# Patient Record
Sex: Male | Born: 1960
Health system: Southern US, Community
[De-identification: ages and names within clinical notes are randomized; demographics above are authoritative.]

## PROBLEM LIST (undated history)

## (undated) DIAGNOSIS — D126 Benign neoplasm of colon, unspecified: Secondary | ICD-10-CM

## (undated) DIAGNOSIS — N529 Male erectile dysfunction, unspecified: Secondary | ICD-10-CM

## (undated) DIAGNOSIS — Z8042 Family history of malignant neoplasm of prostate: Secondary | ICD-10-CM

## (undated) DIAGNOSIS — G5603 Carpal tunnel syndrome, bilateral upper limbs: Secondary | ICD-10-CM

## (undated) DIAGNOSIS — S37039A Laceration of unspecified kidney, unspecified degree, initial encounter: Secondary | ICD-10-CM

## (undated) DIAGNOSIS — I10 Essential (primary) hypertension: Secondary | ICD-10-CM

## (undated) DIAGNOSIS — M159 Polyosteoarthritis, unspecified: Secondary | ICD-10-CM

## (undated) DIAGNOSIS — R972 Elevated prostate specific antigen [PSA]: Secondary | ICD-10-CM

## (undated) DIAGNOSIS — R7301 Impaired fasting glucose: Secondary | ICD-10-CM

## (undated) DIAGNOSIS — K5790 Diverticulosis of intestine, part unspecified, without perforation or abscess without bleeding: Secondary | ICD-10-CM

## (undated) DIAGNOSIS — E785 Hyperlipidemia, unspecified: Secondary | ICD-10-CM

## (undated) DIAGNOSIS — R7303 Prediabetes: Secondary | ICD-10-CM

## (undated) DIAGNOSIS — G4700 Insomnia, unspecified: Secondary | ICD-10-CM

## (undated) DIAGNOSIS — M797 Fibromyalgia: Secondary | ICD-10-CM

## (undated) DIAGNOSIS — M130 Polyarthritis, unspecified: Secondary | ICD-10-CM

## (undated) DIAGNOSIS — Z8719 Personal history of other diseases of the digestive system: Secondary | ICD-10-CM

## (undated) DIAGNOSIS — S36039A Unspecified laceration of spleen, initial encounter: Secondary | ICD-10-CM

## (undated) DIAGNOSIS — T7840XA Allergy, unspecified, initial encounter: Secondary | ICD-10-CM

## (undated) DIAGNOSIS — K219 Gastro-esophageal reflux disease without esophagitis: Secondary | ICD-10-CM

## (undated) HISTORY — DX: Hyperlipidemia, unspecified: E78.5

## (undated) HISTORY — DX: Male erectile dysfunction, unspecified: N52.9

## (undated) HISTORY — DX: Laceration of unspecified kidney, unspecified degree, initial encounter: S37.039A

## (undated) HISTORY — DX: Polyosteoarthritis, unspecified: M15.9

## (undated) HISTORY — DX: Unspecified laceration of spleen, initial encounter: S36.039A

## (undated) HISTORY — DX: Allergy, unspecified, initial encounter: T78.40XA

## (undated) HISTORY — PX: APPENDECTOMY: SHX54

## (undated) HISTORY — DX: Personal history of other diseases of the digestive system: Z87.19

## (undated) HISTORY — DX: Essential (primary) hypertension: I10

## (undated) HISTORY — DX: Family history of malignant neoplasm of prostate: Z80.42

## (undated) HISTORY — PX: COLONOSCOPY: SHX174

## (undated) HISTORY — DX: Elevated prostate specific antigen (PSA): R97.20

## (undated) HISTORY — DX: Diverticulosis of intestine, part unspecified, without perforation or abscess without bleeding: K57.90

## (undated) HISTORY — PX: POLYPECTOMY: SHX149

## (undated) HISTORY — PX: JOINT REPLACEMENT: SHX530

## (undated) HISTORY — PX: TOTAL KNEE ARTHROPLASTY: SHX125

## (undated) HISTORY — DX: Insomnia, unspecified: G47.00

## (undated) HISTORY — PX: SPINE SURGERY: SHX786

## (undated) HISTORY — PX: BILATERAL CARPAL TUNNEL RELEASE: SHX6508

## (undated) HISTORY — PX: KNEE SURGERY: SHX244

## (undated) HISTORY — DX: Polyarthritis, unspecified: M13.0

## (undated) HISTORY — DX: Carpal tunnel syndrome, bilateral upper limbs: G56.03

## (undated) HISTORY — DX: Prediabetes: R73.03

## (undated) HISTORY — PX: CERVICAL FUSION: SHX112

## (undated) HISTORY — DX: Benign neoplasm of colon, unspecified: D12.6

---
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
## 2016-01-26 ENCOUNTER — Ambulatory Visit: Payer: BLUE CROSS/BLUE SHIELD | Admitting: Family Medicine

## 2016-02-09 ENCOUNTER — Encounter: Payer: Self-pay | Admitting: Family Medicine

## 2016-02-09 DIAGNOSIS — Z125 Encounter for screening for malignant neoplasm of prostate: Secondary | ICD-10-CM | POA: Insufficient documentation

## 2016-02-14 ENCOUNTER — Encounter: Payer: Self-pay | Admitting: Family Medicine

## 2016-02-14 ENCOUNTER — Ambulatory Visit (INDEPENDENT_AMBULATORY_CARE_PROVIDER_SITE_OTHER): Payer: BLUE CROSS/BLUE SHIELD | Admitting: Family Medicine

## 2016-02-14 VITALS — BP 119/73 | HR 70 | Temp 98.4°F | Resp 16 | Ht 70.5 in | Wt 179.8 lb

## 2016-02-14 DIAGNOSIS — F5105 Insomnia due to other mental disorder: Secondary | ICD-10-CM

## 2016-02-14 DIAGNOSIS — G5762 Lesion of plantar nerve, left lower limb: Secondary | ICD-10-CM

## 2016-02-14 DIAGNOSIS — F419 Anxiety disorder, unspecified: Secondary | ICD-10-CM | POA: Diagnosis not present

## 2016-02-14 DIAGNOSIS — M5010 Cervical disc disorder with radiculopathy, unspecified cervical region: Secondary | ICD-10-CM | POA: Diagnosis not present

## 2016-02-14 DIAGNOSIS — I10 Essential (primary) hypertension: Secondary | ICD-10-CM | POA: Diagnosis not present

## 2016-02-14 MED ORDER — LORAZEPAM 0.5 MG PO TABS: 0.5000 mg | ORAL_TABLET | Freq: Every day | ORAL | Status: DC | PRN

## 2016-02-14 NOTE — Patient Instructions (Signed)
1) [facility]: Dr. Berniece Andreas. [phone]  2) Metatarsal pad (shoe insert).

## 2016-02-14 NOTE — Progress Notes (Signed)
Office Note 02/14/2016  CC:  Chief Complaint  Patient presents with  . Establish Care    Pt is not fasting.   . Foot Pain    left foot, 2nd digit x 1.5 months    HPI:  Billy Bates is a 55 y.o. White male who is here to establish care. Patient's most recent primary MD: [facility] in [place], [place]. Old records were reviewed prior to or during today's visit.  His main chronic issue is HTN, which he states is well controlled and he has no probs with his medication.  He also has some anxiety-related insomnia, for which he uses ativan 0.5mg  qhs prn with some success.  Asks for RF of this med today.  Pt states he is getting persistent numbness sensation in R shoulder blade extending all the way down R arm to tips of fingers (all).  Going on for a few months now.  No preceding trauma or strain. Sits in front of a computer a lot at work.  No weakness of arm.  Used to see chiropracter for his neck and this was helpful.  Has hx of cervical spondylosis/DDD with fusion surgery in remote past.  Left 2nd toe pain, swelling at the base.  Getting progressively worse.  This alters his gait sometimes. He occ takes ibuprofen "when it gets bad enough".  Past Medical History  Diagnosis Date  . Hypertension   . Insomnia   . Splenic laceration     Hx of, 1980  . Kidney laceration     Hx of, 1980  . History of colitis     Lymphocytic colitis; episodic flares (most recent approx 2015)  . Organic impotence   . Elevated PSA     Repeat 1 mo later was back down.  No bx.  No urology referral.  . Hyperlipidemia     controlled with TLC.  . Family history of prostate cancer     Father  . Arthritis     neck/knees    Past Surgical History  Procedure Laterality Date  . Cervical fusion  approx 1999    C5-6  . Knee surgery      Ex baseball player.  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 Colonoscopy  2009    polypectomy (adenomatous)--overdue for repeat  . Tonsillectomy and  adenoidectomy  1968    Family History  Problem Relation Age of Onset  . Breast cancer Mother   . Heart disease Mother   . Arthritis Father   . Prostate cancer Father   . Stroke Father   . Hypertension Father   . Diabetes Neg Hx     Social History   Social History  . Marital Status: Married    Spouse Name: N/A  . Number of Children: N/A  . Years of Education: N/A   Occupational History  . Not on file.   Social History Main Topics  . Smoking status: Never Smoker   . Smokeless tobacco: Current User    Types: Snuff  . Alcohol Use: 1.2 - 1.8 oz/week    2-3 Cans of beer per week  . Drug Use: No  . Sexual Activity: Not on file   Other Topics Concern  . Not on file   Social History Narrative   Married, 2 daughters.   Educ: BS   Occupation: Freight forwarder for [employer]      No [place] or drugs.  Occ alcohol.    Outpatient Encounter Prescriptions as of 02/14/2016  Medication Sig  . lisinopril (PRINIVIL,ZESTRIL) 10 MG tablet Take 10 mg by mouth daily.  [name] LORazepam (ATIVAN) 0.5 MG tablet Take 1 tablet (0.5 mg total) by mouth daily as needed.  . [DISCONTINUED] LORazepam (ATIVAN) 0.5 MG tablet Take 0.5 mg by mouth daily as needed.   No facility-administered encounter medications on file as of 02/14/2016.    No Known Allergies  ROS Review of Systems  Constitutional: Negative for fever and fatigue.  HENT: Negative for congestion and sore throat.   Eyes: Negative for visual disturbance.  Respiratory: Negative for cough.   Cardiovascular: Negative for chest pain.  Gastrointestinal: Negative for nausea and abdominal pain.  Genitourinary: Negative for dysuria.  Musculoskeletal: Negative for back pain and joint swelling.  Skin: Negative for rash.  Neurological: Negative for weakness and headaches.  Hematological: Negative for adenopathy.    PE; Blood pressure 119/73, pulse 70, temperature 98.4 F (36.9 C), temperature source Oral, resp. rate 16, height 5' 10.5" (1.791 m), weight  179 lb 12 oz (81.534 kg), SpO2 96 %. Body mass index is 25.42 kg/(m^2).  Gen: Alert, well appearing.  Patient is oriented to person, place, time, and situation. CV: RRR, no m/r/g.   LUNGS: CTA bilat, nonlabored resps, good aeration in all lung fields. EXT: no clubbing, cyanosis, or edema.  Neck: mild diffuse "soreness" to deep palpation of cervical spine soft tissues. Spurling's + (elicits radiculopathy sx's down R arm).  UE strength 5/5 prox and dist bilat. DTRs: 1+ in biceps on L, absent biceps reflex on R. Soft touch sensation intact in both arms/symmetric. Left foot: Mild soft tissue swelling at 2nd toe MTP joint.  Diffuse tenderness over this joint and distal 2nd metatarsal and web space between toes 1&2 and toes 2 & 3.  No erythema.  ROM intact.  More tender on plantar aspect than dorsal aspect.  Pertinent labs:  None today  ASSESSMENT AND PLAN:   1) Essential HTN: The current medical regimen is effective;  continue present plan and medications.  2) Insomnia: The current medical regimen is effective;  continue present plan and medications. RF'd ativan today.  3) Left foot Morton neuroma suspected around 2nd MTP region. Pt not interested in referral for injection at this time. Discussed metatarsal shoe insert today.  4) Cervical radiculopathy, right: pt has had success with chiropractic manipulations in the past when this occurred on L side.  Gave pt local chiropracter office info [facility], Dr. Berniece Andreas).  An After Visit Summary was printed and given to the patient.  Return for annual CPE with fasting labs the week prior--at patient's convenience.  Signed:  Crissie Sickles, MD           02/14/2016

## 2016-02-14 NOTE — Progress Notes (Signed)
Pre visit review using our clinic review tool, if applicable. No additional management support is needed unless otherwise documented below in the visit note. 

## 2016-02-17 DIAGNOSIS — M5033 Other cervical disc degeneration, cervicothoracic region: Secondary | ICD-10-CM | POA: Diagnosis not present

## 2016-02-17 DIAGNOSIS — M9901 Segmental and somatic dysfunction of cervical region: Secondary | ICD-10-CM | POA: Diagnosis not present

## 2016-02-17 DIAGNOSIS — M531 Cervicobrachial syndrome: Secondary | ICD-10-CM | POA: Diagnosis not present

## 2016-02-17 DIAGNOSIS — M5032 Other cervical disc degeneration, mid-cervical region, unspecified level: Secondary | ICD-10-CM | POA: Diagnosis not present

## 2016-02-21 DIAGNOSIS — M5032 Other cervical disc degeneration, mid-cervical region, unspecified level: Secondary | ICD-10-CM | POA: Diagnosis not present

## 2016-02-21 DIAGNOSIS — M9901 Segmental and somatic dysfunction of cervical region: Secondary | ICD-10-CM | POA: Diagnosis not present

## 2016-02-21 DIAGNOSIS — M5033 Other cervical disc degeneration, cervicothoracic region: Secondary | ICD-10-CM | POA: Diagnosis not present

## 2016-02-21 DIAGNOSIS — M531 Cervicobrachial syndrome: Secondary | ICD-10-CM | POA: Diagnosis not present

## 2016-02-24 DIAGNOSIS — M5032 Other cervical disc degeneration, mid-cervical region, unspecified level: Secondary | ICD-10-CM | POA: Diagnosis not present

## 2016-02-24 DIAGNOSIS — M9901 Segmental and somatic dysfunction of cervical region: Secondary | ICD-10-CM | POA: Diagnosis not present

## 2016-02-24 DIAGNOSIS — M5033 Other cervical disc degeneration, cervicothoracic region: Secondary | ICD-10-CM | POA: Diagnosis not present

## 2016-02-24 DIAGNOSIS — M531 Cervicobrachial syndrome: Secondary | ICD-10-CM | POA: Diagnosis not present

## 2016-02-27 DIAGNOSIS — M9901 Segmental and somatic dysfunction of cervical region: Secondary | ICD-10-CM | POA: Diagnosis not present

## 2016-02-27 DIAGNOSIS — M5032 Other cervical disc degeneration, mid-cervical region, unspecified level: Secondary | ICD-10-CM | POA: Diagnosis not present

## 2016-02-27 DIAGNOSIS — M5033 Other cervical disc degeneration, cervicothoracic region: Secondary | ICD-10-CM | POA: Diagnosis not present

## 2016-02-27 DIAGNOSIS — M531 Cervicobrachial syndrome: Secondary | ICD-10-CM | POA: Diagnosis not present

## 2016-03-02 DIAGNOSIS — M5033 Other cervical disc degeneration, cervicothoracic region: Secondary | ICD-10-CM | POA: Diagnosis not present

## 2016-03-02 DIAGNOSIS — M531 Cervicobrachial syndrome: Secondary | ICD-10-CM | POA: Diagnosis not present

## 2016-03-02 DIAGNOSIS — M5032 Other cervical disc degeneration, mid-cervical region, unspecified level: Secondary | ICD-10-CM | POA: Diagnosis not present

## 2016-03-02 DIAGNOSIS — M9901 Segmental and somatic dysfunction of cervical region: Secondary | ICD-10-CM | POA: Diagnosis not present

## 2016-03-06 ENCOUNTER — Telehealth: Payer: Self-pay | Admitting: Family Medicine

## 2016-03-06 DIAGNOSIS — M531 Cervicobrachial syndrome: Secondary | ICD-10-CM | POA: Diagnosis not present

## 2016-03-06 DIAGNOSIS — M5033 Other cervical disc degeneration, cervicothoracic region: Secondary | ICD-10-CM | POA: Diagnosis not present

## 2016-03-06 DIAGNOSIS — M5032 Other cervical disc degeneration, mid-cervical region, unspecified level: Secondary | ICD-10-CM | POA: Diagnosis not present

## 2016-03-06 DIAGNOSIS — M9901 Segmental and somatic dysfunction of cervical region: Secondary | ICD-10-CM | POA: Diagnosis not present

## 2016-03-06 MED ORDER — PREDNISONE 20 MG PO TABS: ORAL_TABLET | ORAL | 0 refills | Status: DC

## 2016-03-06 NOTE — Telephone Encounter (Signed)
Patient came into office stating that he was at [facility] this morning and was asked to see if Dr. Anitra Lauth could Rx a dose of prednisone reduce inflammation to help.   Please advise.

## 2016-03-06 NOTE — Telephone Encounter (Signed)
OK.  Pls eRx prednisone 20mg , 2 tabs po qd x 5d, then 1 tab po qd x 5d, #15, no RF.--thx

## 2016-03-06 NOTE — Telephone Encounter (Signed)
Patient aware of Rx at pharmacy.

## 2016-03-13 DIAGNOSIS — M9901 Segmental and somatic dysfunction of cervical region: Secondary | ICD-10-CM | POA: Diagnosis not present

## 2016-03-13 DIAGNOSIS — M531 Cervicobrachial syndrome: Secondary | ICD-10-CM | POA: Diagnosis not present

## 2016-03-13 DIAGNOSIS — M5032 Other cervical disc degeneration, mid-cervical region, unspecified level: Secondary | ICD-10-CM | POA: Diagnosis not present

## 2016-03-13 DIAGNOSIS — M5033 Other cervical disc degeneration, cervicothoracic region: Secondary | ICD-10-CM | POA: Diagnosis not present

## 2016-03-21 DIAGNOSIS — M531 Cervicobrachial syndrome: Secondary | ICD-10-CM | POA: Diagnosis not present

## 2016-03-21 DIAGNOSIS — M5033 Other cervical disc degeneration, cervicothoracic region: Secondary | ICD-10-CM | POA: Diagnosis not present

## 2016-03-21 DIAGNOSIS — M5032 Other cervical disc degeneration, mid-cervical region, unspecified level: Secondary | ICD-10-CM | POA: Diagnosis not present

## 2016-03-21 DIAGNOSIS — M9901 Segmental and somatic dysfunction of cervical region: Secondary | ICD-10-CM | POA: Diagnosis not present

## 2016-03-25 ENCOUNTER — Encounter: Payer: Self-pay | Admitting: Family Medicine

## 2016-03-25 ENCOUNTER — Ambulatory Visit (INDEPENDENT_AMBULATORY_CARE_PROVIDER_SITE_OTHER): Payer: BLUE CROSS/BLUE SHIELD | Admitting: Family Medicine

## 2016-03-25 VITALS — BP 117/75 | HR 57 | Temp 97.4°F | Resp 16 | Ht 70.0 in | Wt 180.0 lb

## 2016-03-25 DIAGNOSIS — Z125 Encounter for screening for malignant neoplasm of prostate: Secondary | ICD-10-CM

## 2016-03-25 DIAGNOSIS — Z23 Encounter for immunization: Secondary | ICD-10-CM

## 2016-03-25 DIAGNOSIS — Z86010 Personal history of colonic polyps: Secondary | ICD-10-CM | POA: Diagnosis not present

## 2016-03-25 DIAGNOSIS — Z0000 Encounter for general adult medical examination without abnormal findings: Secondary | ICD-10-CM

## 2016-03-25 LAB — COMPREHENSIVE METABOLIC PANEL
ALT: 42 U/L (ref 0–53)
AST: 22 U/L (ref 0–37)
Albumin: 4.2 g/dL (ref 3.5–5.2)
Alkaline Phosphatase: 46 U/L (ref 39–117)
BUN: 14 mg/dL (ref 6–23)
CO2: 30 mEq/L (ref 19–32)
Calcium: 9.6 mg/dL (ref 8.4–10.5)
Chloride: 105 mEq/L (ref 96–112)
Creatinine, Ser: 0.97 mg/dL (ref 0.40–1.50)
GFR: 84.96 mL/min (ref >60.00)
Glucose, Bld: 89 mg/dL (ref 70–99)
Potassium: 5.3 mEq/L — ABNORMAL HIGH (ref 3.5–5.1)
Sodium: 139 mEq/L (ref 135–145)
Total Bilirubin: 0.9 mg/dL (ref 0.2–1.2)
Total Protein: 6.9 g/dL (ref 6.0–8.3)

## 2016-03-25 LAB — CBC WITH DIFFERENTIAL/PLATELET
Basophils Absolute: 0 10*3/uL (ref 0.0–0.1)
Basophils Relative: 0.5 % (ref 0.0–3.0)
Eosinophils Absolute: 0.1 10*3/uL (ref 0.0–0.7)
Eosinophils Relative: 1.5 % (ref 0.0–5.0)
HCT: 47.5 % (ref 39.0–52.0)
Hemoglobin: 16 g/dL (ref 13.0–17.0)
Lymphocytes Relative: 27.6 % (ref 12.0–46.0)
Lymphs Abs: 2.6 10*3/uL (ref 0.7–4.0)
MCHC: 33.8 g/dL (ref 30.0–36.0)
MCV: 90.1 fl (ref 78.0–100.0)
Monocytes Absolute: 0.8 10*3/uL (ref 0.1–1.0)
Monocytes Relative: 8.3 % (ref 3.0–12.0)
Neutro Abs: 5.7 10*3/uL (ref 1.4–7.7)
Neutrophils Relative %: 62.1 % (ref 43.0–77.0)
Platelets: 250 10*3/uL (ref 150.0–400.0)
RBC: 5.27 Mil/uL (ref 4.22–5.81)
RDW: 14.2 % (ref 11.5–15.5)
WBC: 9.2 10*3/uL (ref 4.0–10.5)

## 2016-03-25 LAB — TSH: TSH: 0.94 u[IU]/mL (ref 0.35–4.50)

## 2016-03-25 LAB — LIPID PANEL
Cholesterol: 225 mg/dL — ABNORMAL HIGH (ref 0–200)
HDL: 54.2 mg/dL (ref >39.00)
LDL Cholesterol: 141 mg/dL — ABNORMAL HIGH (ref 0–99)
NonHDL: 171
Total CHOL/HDL Ratio: 4
Triglycerides: 149 mg/dL (ref 0.0–149.0)
VLDL: 29.8 mg/dL (ref 0.0–40.0)

## 2016-03-25 LAB — PSA: PSA: 2.69 ng/mL (ref 0.10–4.00)

## 2016-03-25 NOTE — Progress Notes (Signed)
Office Note 03/25/2016  CC:  Chief Complaint  Patient presents with  . Annual Exam    HPI:  Billy Bates is a 55 y.o. White male who is here for annual health maintenance exam. No acute complaints. Eye exam UTD, as are dental visits.    Past Medical History:  Diagnosis Date  . Arthritis    neck/knees  . Elevated PSA    Repeat 1 mo later was back down.  No bx.  No urology referral.  . Family history of prostate cancer    Father  . History of colitis    Lymphocytic colitis; episodic flares (most recent approx 2015)  . Hyperlipidemia    controlled with TLC.  [name] Hypertension   . Insomnia   . Kidney laceration    Hx of, 1980  . Organic impotence   . Splenic laceration    Hx of, 1980    Past Surgical History:  Procedure Laterality Date  . CERVICAL FUSION  approx 1999   C5-6  . COLONOSCOPY  2009   polypectomy (adenomatous)--overdue for repeat  . KNEE SURGERY     Ex baseball player.  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 TONSILLECTOMY AND ADENOIDECTOMY  1968    Family History  Problem Relation Age of Onset  . Breast cancer Mother   . Heart disease Mother   . Arthritis Father   . Prostate cancer Father   . Stroke Father   . Hypertension Father   . Diabetes Neg Hx     Social History   Social History  . Marital status: Married    Spouse name: N/A  . Number of children: N/A  . Years of education: N/A   Occupational History  . Not on file.   Social History Main Topics  . Smoking status: Never Smoker  . Smokeless tobacco: Current User    Types: Snuff  . Alcohol use 1.2 - 1.8 oz/week    2 - 3 Cans of beer per week  . Drug use: No  . Sexual activity: Not on file   Other Topics Concern  . Not on file   Social History Narrative   Married, 2 daughters.   Educ: BS   Occupation: Freight forwarder for [employer]      No [place] or drugs.  Occ alcohol.    Outpatient Medications Prior to Visit  Medication Sig Dispense Refill  . lisinopril  (PRINIVIL,ZESTRIL) 10 MG tablet Take 10 mg by mouth daily.  2  . LORazepam (ATIVAN) 0.5 MG tablet Take 1 tablet (0.5 mg total) by mouth daily as needed. 30 tablet 5  . predniSONE (DELTASONE) 20 MG tablet takbe 2 tabs PO QD X 5 days, then 1 tab PO QD x 5 days. 15 tablet 0   No facility-administered medications prior to visit.     No Known Allergies  ROS Review of Systems  Constitutional: Negative for appetite change, chills, fatigue and fever.  HENT: Negative for congestion, dental problem, ear pain and sore throat.   Eyes: Negative for discharge, redness and visual disturbance.  Respiratory: Negative for cough, chest tightness, shortness of breath and wheezing.   Cardiovascular: Negative for chest pain, palpitations and leg swelling.  Gastrointestinal: Negative for abdominal pain, blood in stool, diarrhea, nausea and vomiting.  Genitourinary: Negative for difficulty urinating, dysuria, flank pain, frequency, hematuria and urgency.  Musculoskeletal: Negative for arthralgias, back pain, joint swelling, myalgias and neck stiffness.  Skin: Negative for pallor and rash.  Neurological: Negative for dizziness,  speech difficulty, weakness and headaches.  Hematological: Negative for adenopathy. Does not bruise/bleed easily.  Psychiatric/Behavioral: Negative for confusion and sleep disturbance. The patient is not nervous/anxious.     PE; Blood pressure 117/75, pulse (!) 57, temperature 97.4 F (36.3 C), temperature source Oral, resp. rate 16, height 5\' 10"  (1.778 m), weight 180 lb (81.6 kg), SpO2 98 %. Gen: Alert, well appearing.  Patient is oriented to person, place, time, and situation. AFFECT: pleasant, lucid thought and speech. ENT: Ears: EACs clear, normal epithelium.  TMs with good light reflex and landmarks bilaterally.  Eyes: no injection, icteris, swelling, or exudate.  EOMI, PERRLA. Nose: no drainage or turbinate edema/swelling.  No injection or focal lesion.  Mouth: lips without  lesion/swelling.  Oral mucosa pink and moist.  Dentition intact and without obvious caries or gingival swelling.  Oropharynx without erythema, exudate, or swelling.  Neck: supple/nontender.  No LAD, mass, or TM.  Carotid pulses 2+ bilaterally, without bruits. CV: RRR, no m/r/g.   LUNGS: CTA bilat, nonlabored resps, good aeration in all lung fields. ABD: soft, NT, ND, BS normal.  No hepatospenomegaly or mass.  No bruits. EXT: no clubbing, cyanosis, or edema.  Musculoskeletal: no joint swelling, erythema, warmth, or tenderness.  ROM of all joints intact. Skin - no sores or suspicious lesions or rashes or color changes Rectal exam: negative without mass, lesions or tenderness, PROSTATE EXAM: smooth and symmetric without nodules or tenderness.  Pertinent labs:  NONE TODAY  ASSESSMENT AND PLAN:   Health maintenance exam: Reviewed age and gender appropriate health maintenance issues (prudent diet, regular exercise, health risks of tobacco and excessive alcohol, use of seatbelts, fire alarms in home, use of sunscreen).  Also reviewed age and gender appropriate health screening as well as vaccine recommendations. Tdap today. Fasting HP today. Prostate ca screening: DRE normal today, PSA drawn. Colon ca screening: last 2009, +polyps, overdue for repeat.  Refer to GI.  An After Visit Summary was printed and given to the patient.  FOLLOW UP:  Return in about 1 year (around 03/25/2017) for annual CPE (fasting).  Signed:  Crissie Sickles, MD           03/25/2016

## 2016-03-27 DIAGNOSIS — M5032 Other cervical disc degeneration, mid-cervical region, unspecified level: Secondary | ICD-10-CM | POA: Diagnosis not present

## 2016-03-27 DIAGNOSIS — M531 Cervicobrachial syndrome: Secondary | ICD-10-CM | POA: Diagnosis not present

## 2016-03-27 DIAGNOSIS — M9901 Segmental and somatic dysfunction of cervical region: Secondary | ICD-10-CM | POA: Diagnosis not present

## 2016-03-27 DIAGNOSIS — M5033 Other cervical disc degeneration, cervicothoracic region: Secondary | ICD-10-CM | POA: Diagnosis not present

## 2016-04-10 DIAGNOSIS — M5033 Other cervical disc degeneration, cervicothoracic region: Secondary | ICD-10-CM | POA: Diagnosis not present

## 2016-04-10 DIAGNOSIS — M9901 Segmental and somatic dysfunction of cervical region: Secondary | ICD-10-CM | POA: Diagnosis not present

## 2016-04-10 DIAGNOSIS — M531 Cervicobrachial syndrome: Secondary | ICD-10-CM | POA: Diagnosis not present

## 2016-04-10 DIAGNOSIS — M5032 Other cervical disc degeneration, mid-cervical region, unspecified level: Secondary | ICD-10-CM | POA: Diagnosis not present

## 2016-04-17 DIAGNOSIS — M531 Cervicobrachial syndrome: Secondary | ICD-10-CM | POA: Diagnosis not present

## 2016-04-17 DIAGNOSIS — M9901 Segmental and somatic dysfunction of cervical region: Secondary | ICD-10-CM | POA: Diagnosis not present

## 2016-04-17 DIAGNOSIS — M5033 Other cervical disc degeneration, cervicothoracic region: Secondary | ICD-10-CM | POA: Diagnosis not present

## 2016-04-17 DIAGNOSIS — M5032 Other cervical disc degeneration, mid-cervical region, unspecified level: Secondary | ICD-10-CM | POA: Diagnosis not present

## 2016-07-25 ENCOUNTER — Encounter: Payer: Self-pay | Admitting: Family Medicine

## 2016-09-11 ENCOUNTER — Ambulatory Visit (INDEPENDENT_AMBULATORY_CARE_PROVIDER_SITE_OTHER): Payer: BLUE CROSS/BLUE SHIELD | Admitting: Family Medicine

## 2016-09-11 ENCOUNTER — Encounter: Payer: Self-pay | Admitting: Family Medicine

## 2016-09-11 VITALS — BP 129/88 | HR 74 | Temp 98.1°F | Resp 16 | Ht 70.0 in | Wt 188.0 lb

## 2016-09-11 DIAGNOSIS — J111 Influenza due to unidentified influenza virus with other respiratory manifestations: Secondary | ICD-10-CM | POA: Diagnosis not present

## 2016-09-11 MED ORDER — OSELTAMIVIR PHOSPHATE 75 MG PO CAPS: 75.0000 mg | ORAL_CAPSULE | Freq: Two times a day (BID) | ORAL | 0 refills | Status: DC

## 2016-09-11 MED ORDER — HYDROCODONE-HOMATROPINE 5-1.5 MG/5ML PO SYRP: ORAL_SOLUTION | ORAL | 0 refills | Status: DC

## 2016-09-11 NOTE — Progress Notes (Signed)
OFFICE VISIT  09/11/2016   CC:  Chief Complaint  Patient presents with  . Cough    congestion, Headache x 2 days   HPI:    Patient is a 56 y.o. Caucasian male who presents for resp symptoms. Onset 48-72h ago, diffuse body aches, nasal cong/runny nose, cough, HA, scratchy throat.  No SOB, wheezing, or chest tightness. Subjective fever last couple nights.  Saline nasal spray, vicks vaporub, theraflu all have been tried. Says he is really gassy but no n/v/d.  Drinking fluids well. No flu vaccine this season.  No known sick contacts.  Past Medical History:  Diagnosis Date  . Arthritis    neck/knees  . Elevated PSA    Repeat 1 mo later was back down.  No bx.  No urology referral.  . Family history of prostate cancer    Father  . History of colitis    Lymphocytic colitis; episodic flares (most recent approx 2015)  . Hyperlipidemia    borderline--controlled with TLC.  [name] Hypertension   . Insomnia   . Kidney laceration    Hx of, 1980  . Organic impotence   . Splenic laceration    Hx of, 1980    Past Surgical History:  Procedure Laterality Date  . CERVICAL FUSION  approx 1999   C5-6  . COLONOSCOPY  2009   polypectomy (adenomatous)--overdue for repeat  . KNEE SURGERY     Ex baseball player.  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 TONSILLECTOMY AND ADENOIDECTOMY  1968    Outpatient Medications Prior to Visit  Medication Sig Dispense Refill  . lisinopril (PRINIVIL,ZESTRIL) 10 MG tablet Take 10 mg by mouth daily.  2  . LORazepam (ATIVAN) 0.5 MG tablet Take 1 tablet (0.5 mg total) by mouth daily as needed. 30 tablet 5   No facility-administered medications prior to visit.     No Known Allergies  ROS As per HPI  PE: Blood pressure 129/88, pulse 74, temperature 98.1 F (36.7 C), temperature source Temporal, resp. rate 16, height 5\' 10"  (1.778 m), weight 188 lb (85.3 kg), SpO2 98 %. VS: noted--normal. Gen: alert, NAD, NONTOXIC APPEARING. HEENT: eyes without  injection, drainage, or swelling.  Ears: EACs clear, TMs with normal light reflex and landmarks.  Nose: Clear rhinorrhea, with some dried, crusty exudate adherent to mildly injected mucosa.  No purulent d/c.  No paranasal sinus TTP.  No facial swelling.  Throat and mouth without focal lesion.  No pharyngial swelling, erythema, or exudate.   Neck: supple, no LAD.   LUNGS: CTA bilat, nonlabored resps.   CV: RRR, no m/r/g. EXT: no c/c/e SKIN: no rash   LABS:    Chemistry      Component Value Date/Time   NA 139 03/25/2016 0926   K 5.3 (H) 03/25/2016 0926   CL 105 03/25/2016 0926   CO2 30 03/25/2016 0926   BUN 14 03/25/2016 0926   CREATININE 0.97 03/25/2016 0926      Component Value Date/Time   CALCIUM 9.6 03/25/2016 0926   ALKPHOS 46 03/25/2016 0926   AST 22 03/25/2016 0926   ALT 42 03/25/2016 0926   BILITOT 0.9 03/25/2016 0926       IMPRESSION AND PLAN:  Influenza-like illness. Will treat empirically with tamiflu 75mg  bid x 5d. Get otc generic robitussin DM OR Mucinex DM and use as directed on the packaging for cough and congestion. Use otc generic saline nasal spray 2-3 times per day to irrigate/moisturize your nasal passages. Hycodan  syrup, 1-2 tsp qhs prn cough, #120 ml.  Therapeutic expectations and side effect profile of medication discussed today.  Patient's questions answered.  An After Visit Summary was printed and given to the patient.  FOLLOW UP: Return if symptoms worsen or fail to improve.  Signed:  Crissie Sickles, MD           09/11/2016

## 2016-09-11 NOTE — Patient Instructions (Signed)
Get otc generic robitussin DM OR Mucinex DM and use as directed on the packaging for cough and congestion. Use otc generic saline nasal spray 2-3 times per day to irrigate/moisturize your nasal passages.   

## 2016-09-11 NOTE — Progress Notes (Signed)
Pre visit review using our clinic review tool, if applicable. No additional management support is needed unless otherwise documented below in the visit note. 

## 2017-04-24 ENCOUNTER — Ambulatory Visit (INDEPENDENT_AMBULATORY_CARE_PROVIDER_SITE_OTHER): Payer: BLUE CROSS/BLUE SHIELD | Admitting: Family Medicine

## 2017-04-24 ENCOUNTER — Encounter: Payer: Self-pay | Admitting: Family Medicine

## 2017-04-24 ENCOUNTER — Other Ambulatory Visit: Payer: Self-pay | Admitting: *Deleted

## 2017-04-24 VITALS — BP 118/75 | HR 63 | Temp 97.9°F | Resp 16 | Ht 70.0 in | Wt 188.2 lb

## 2017-04-24 DIAGNOSIS — Z125 Encounter for screening for malignant neoplasm of prostate: Secondary | ICD-10-CM

## 2017-04-24 DIAGNOSIS — Z23 Encounter for immunization: Secondary | ICD-10-CM

## 2017-04-24 DIAGNOSIS — I10 Essential (primary) hypertension: Secondary | ICD-10-CM | POA: Diagnosis not present

## 2017-04-24 DIAGNOSIS — Z0000 Encounter for general adult medical examination without abnormal findings: Secondary | ICD-10-CM

## 2017-04-24 MED ORDER — LISINOPRIL 10 MG PO TABS: 10.0000 mg | ORAL_TABLET | Freq: Every day | ORAL | 1 refills | Status: DC

## 2017-04-24 NOTE — Progress Notes (Signed)
OFFICE VISIT  04/24/2017   CC:  Chief Complaint  Patient presents with  . Follow-up    HTN   HPI:    Patient is a 56 y.o.  male who presents for f/u HTN. Compliant with bp med daily. Not home monitoring much; occ check when stressed and it is still <130/80. Exercise: active all day, playing some golf.   Diet: not eating a low Na diet.  Past Medical History:  Diagnosis Date  . Arthritis    neck/knees  . Elevated PSA    Repeat 1 mo later was back down.  No bx.  No urology referral.  . Family history of prostate cancer    Father  . History of colitis    Lymphocytic colitis; episodic flares (most recent approx 2015)  . Hyperlipidemia    borderline--controlled with TLC.  [name] Hypertension   . Insomnia   . Kidney laceration    Hx of, 1980  . Organic impotence   . Splenic laceration    Hx of, 1980    Past Surgical History:  Procedure Laterality Date  . CERVICAL FUSION  approx 1999   C5-6  . COLONOSCOPY  2009   polypectomy (adenomatous)--overdue for repeat  . KNEE SURGERY     Ex baseball player.  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 TONSILLECTOMY AND ADENOIDECTOMY  1968    Outpatient Medications Prior to Visit  Medication Sig Dispense Refill  . LORazepam (ATIVAN) 0.5 MG tablet Take 1 tablet (0.5 mg total) by mouth daily as needed. 30 tablet 5  . lisinopril (PRINIVIL,ZESTRIL) 10 MG tablet Take 10 mg by mouth daily.  2  . HYDROcodone-homatropine (HYCODAN) 5-1.5 MG/5ML syrup 1-2 tsp po qhs prn cough (Patient not taking: Reported on 04/24/2017) 120 mL 0  . oseltamivir (TAMIFLU) 75 MG capsule Take 1 capsule (75 mg total) by mouth 2 (two) times daily. (Patient not taking: Reported on 04/24/2017) 10 capsule 0   No facility-administered medications prior to visit.     No Known Allergies  ROS As per HPI  PE: Blood pressure 118/75, pulse 63, temperature 97.9 F (36.6 C), temperature source Oral, resp. rate 16, height 5\' 10"  (1.778 m), weight 188 lb 4 oz (85.4 kg),  SpO2 97 %. Gen: Alert, well appearing.  Patient is oriented to person, place, time, and situation. AFFECT: pleasant, lucid thought and speech. CV: RRR, no m/r/g.   LUNGS: CTA bilat, nonlabored resps, good aeration in all lung fields. EXT: no clubbing, cyanosis, or edema.    LABS:    Chemistry      Component Value Date/Time   NA 139 03/25/2016 0926   K 5.3 (H) 03/25/2016 0926   CL 105 03/25/2016 0926   CO2 30 03/25/2016 0926   BUN 14 03/25/2016 0926   CREATININE 0.97 03/25/2016 0926      Component Value Date/Time   CALCIUM 9.6 03/25/2016 0926   ALKPHOS 46 03/25/2016 0926   AST 22 03/25/2016 0926   ALT 42 03/25/2016 0926   BILITOT 0.9 03/25/2016 0926      IMPRESSION AND PLAN:  HTN; well controlled. Refilled lisinopril 10mg , 1 qd. He'll continue to work on low Na diet and increasing exercise. He confirmed that he is also on 81mg  ASA qd.  An After Visit Summary was printed and given to the patient.  FOLLOW UP: Return for make CPE appt at your convenience in the next 1-3 mo; make lab appt next month (fasting).  Signed:  Crissie Sickles, MD  04/24/2017     

## 2017-04-24 NOTE — Addendum Note (Signed)
Addended by: Onalee Hua on: 04/24/2017 03:44 PM   Modules accepted: Orders

## 2017-05-02 ENCOUNTER — Other Ambulatory Visit: Payer: Self-pay | Admitting: *Deleted

## 2017-05-02 MED ORDER — LORAZEPAM 0.5 MG PO TABS: 0.5000 mg | ORAL_TABLET | Freq: Every day | ORAL | 5 refills | Status: DC | PRN

## 2017-05-02 NOTE — Telephone Encounter (Signed)
[facility]  RF request for lorazepam LOV: 04/24/17 Next ov: 05/16/17 Last written: 02/14/16 #30 w/ 5RF  Please advise. Thanks.

## 2017-05-05 NOTE — Telephone Encounter (Signed)
Rx called into pharmacy since it was RX was printed and not signed.  Rx shredded.

## 2017-05-09 ENCOUNTER — Other Ambulatory Visit (INDEPENDENT_AMBULATORY_CARE_PROVIDER_SITE_OTHER): Payer: BLUE CROSS/BLUE SHIELD

## 2017-05-09 DIAGNOSIS — Z0000 Encounter for general adult medical examination without abnormal findings: Secondary | ICD-10-CM | POA: Diagnosis not present

## 2017-05-09 DIAGNOSIS — Z125 Encounter for screening for malignant neoplasm of prostate: Secondary | ICD-10-CM | POA: Diagnosis not present

## 2017-05-09 LAB — COMPREHENSIVE METABOLIC PANEL
ALT: 26 U/L (ref 0–53)
AST: 20 U/L (ref 0–37)
Albumin: 3.9 g/dL (ref 3.5–5.2)
Alkaline Phosphatase: 50 U/L (ref 39–117)
BUN: 13 mg/dL (ref 6–23)
CO2: 32 mEq/L (ref 19–32)
Calcium: 8.6 mg/dL (ref 8.4–10.5)
Chloride: 105 mEq/L (ref 96–112)
Creatinine, Ser: 0.88 mg/dL (ref 0.40–1.50)
GFR: 94.69 mL/min (ref >60.00)
Glucose, Bld: 99 mg/dL (ref 70–99)
Potassium: 4.4 mEq/L (ref 3.5–5.1)
Sodium: 141 mEq/L (ref 135–145)
Total Bilirubin: 0.6 mg/dL (ref 0.2–1.2)
Total Protein: 6.3 g/dL (ref 6.0–8.3)

## 2017-05-09 LAB — CBC WITH DIFFERENTIAL/PLATELET
Basophils Absolute: 0 10*3/uL (ref 0.0–0.1)
Basophils Relative: 0.5 % (ref 0.0–3.0)
Eosinophils Absolute: 0.1 10*3/uL (ref 0.0–0.7)
Eosinophils Relative: 1.7 % (ref 0.0–5.0)
HCT: 47 % (ref 39.0–52.0)
Hemoglobin: 15.4 g/dL (ref 13.0–17.0)
Lymphocytes Relative: 34.9 % (ref 12.0–46.0)
Lymphs Abs: 2.1 10*3/uL (ref 0.7–4.0)
MCHC: 32.7 g/dL (ref 30.0–36.0)
MCV: 92.6 fl (ref 78.0–100.0)
Monocytes Absolute: 0.5 10*3/uL (ref 0.1–1.0)
Monocytes Relative: 8.7 % (ref 3.0–12.0)
Neutro Abs: 3.2 10*3/uL (ref 1.4–7.7)
Neutrophils Relative %: 54.2 % (ref 43.0–77.0)
Platelets: 230 10*3/uL (ref 150.0–400.0)
RBC: 5.08 Mil/uL (ref 4.22–5.81)
RDW: 14 % (ref 11.5–15.5)
WBC: 6 10*3/uL (ref 4.0–10.5)

## 2017-05-09 LAB — LIPID PANEL
Cholesterol: 216 mg/dL — ABNORMAL HIGH (ref 0–200)
HDL: 43.4 mg/dL (ref >39.00)
NonHDL: 173.05
Total CHOL/HDL Ratio: 5
Triglycerides: 263 mg/dL — ABNORMAL HIGH (ref 0.0–149.0)
VLDL: 52.6 mg/dL — ABNORMAL HIGH (ref 0.0–40.0)

## 2017-05-09 LAB — TSH: TSH: 1.23 u[IU]/mL (ref 0.35–4.50)

## 2017-05-09 LAB — PSA: PSA: 2.27 ng/mL (ref 0.10–4.00)

## 2017-05-09 LAB — LDL CHOLESTEROL, DIRECT: Direct LDL: 110 mg/dL

## 2017-05-10 ENCOUNTER — Encounter: Payer: Self-pay | Admitting: Family Medicine

## 2017-05-12 ENCOUNTER — Other Ambulatory Visit: Payer: Self-pay | Admitting: Family Medicine

## 2017-05-12 DIAGNOSIS — E782 Mixed hyperlipidemia: Secondary | ICD-10-CM

## 2017-05-12 MED ORDER — ATORVASTATIN CALCIUM 20 MG PO TABS: 20.0000 mg | ORAL_TABLET | Freq: Every day | ORAL | 2 refills | Status: DC

## 2017-05-16 ENCOUNTER — Encounter: Payer: BLUE CROSS/BLUE SHIELD | Admitting: Family Medicine

## 2017-05-22 ENCOUNTER — Ambulatory Visit (INDEPENDENT_AMBULATORY_CARE_PROVIDER_SITE_OTHER): Payer: BLUE CROSS/BLUE SHIELD | Admitting: Family Medicine

## 2017-05-22 ENCOUNTER — Encounter: Payer: Self-pay | Admitting: Family Medicine

## 2017-05-22 VITALS — BP 112/69 | HR 63 | Temp 98.0°F | Resp 16 | Ht 70.0 in | Wt 189.0 lb

## 2017-05-22 DIAGNOSIS — Z1211 Encounter for screening for malignant neoplasm of colon: Secondary | ICD-10-CM | POA: Diagnosis not present

## 2017-05-22 DIAGNOSIS — Z0000 Encounter for general adult medical examination without abnormal findings: Secondary | ICD-10-CM

## 2017-05-22 DIAGNOSIS — Z86010 Personal history of colonic polyps: Secondary | ICD-10-CM | POA: Diagnosis not present

## 2017-05-22 NOTE — Patient Instructions (Signed)

## 2017-05-22 NOTE — Progress Notes (Signed)
Office Note 05/22/2017  CC:  Chief Complaint  Patient presents with  . Annual Exam    Lab work done 05/09/17    HPI:  Billy Bates is a 56 y.o. male who is here for annual health maintenance exam. Recent fasting labs discussed in detail today (see lab section below).  All normal except cholesterol elevated and I started him on atorvastatin 20mg  qd.  He has taken this for about a week and has no side effects at this time.  Eyes: exam UTD Dental: preventatives --looking for dentist. Exercise: plays golf regularly, not sedentary.  No standard exercise program. Diet: trying to eat healthy.  Past Medical History:  Diagnosis Date  . Arthritis    neck/knees  . Elevated PSA    Repeat 1 mo later was back down.  No bx.  No urology referral.  . Family history of prostate cancer    Father  . History of colitis    Lymphocytic colitis; episodic flares (most recent approx 2015)  . Hyperlipidemia    borderline--controlled with TLC.  2018 Framingham 10 yr CV risk = 8.1%: atorva started.  . Hypertension   . Insomnia   . Kidney laceration    Hx of, 1980  . Organic impotence   . Splenic laceration    Hx of, 1980    Past Surgical History:  Procedure Laterality Date  . CERVICAL FUSION  approx 1999   C5-6  . COLONOSCOPY  2009   polypectomy (adenomatous)--overdue for repeat  . KNEE SURGERY     Ex baseball player.  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 TONSILLECTOMY AND ADENOIDECTOMY  1968    Family History  Problem Relation Age of Onset  . Breast cancer Mother   . Heart disease Mother   . Arthritis Father   . Prostate cancer Father   . Stroke Father   . Hypertension Father   . Diabetes Neg Hx     Social History   Social History  . Marital status: Married    Spouse name: N/A  . Number of children: N/A  . Years of education: N/A   Occupational History  . Not on file.   Social History Main Topics  . Smoking status: Never Smoker  . Smokeless tobacco:  Current User    Types: Snuff  . Alcohol use 1.2 - 1.8 oz/week    2 - 3 Cans of beer per week  . Drug use: No  . Sexual activity: Not on file   Other Topics Concern  . Not on file   Social History Narrative   Married, 2 daughters.   Educ: BS   Occupation: Freight forwarder for [employer]      No [place] or drugs.  Occ alcohol.    Outpatient Medications Prior to Visit  Medication Sig Dispense Refill  . atorvastatin (LIPITOR) 20 MG tablet Take 1 tablet (20 mg total) by mouth daily. 30 tablet 2  . lisinopril (PRINIVIL,ZESTRIL) 10 MG tablet Take 1 tablet (10 mg total) by mouth daily. 90 tablet 1  . LORazepam (ATIVAN) 0.5 MG tablet Take 1 tablet (0.5 mg total) by mouth daily as needed. 30 tablet 5   No facility-administered medications prior to visit.     No Known Allergies  ROS Review of Systems  Constitutional: Negative for appetite change, chills, fatigue and fever.  HENT: Negative for congestion, dental problem, ear pain and sore throat.   Eyes: Negative for discharge, redness and visual disturbance.  Respiratory: Negative for cough,  chest tightness, shortness of breath and wheezing.   Cardiovascular: Negative for chest pain, palpitations and leg swelling.  Gastrointestinal: Negative for abdominal pain, blood in stool, diarrhea, nausea and vomiting.  Genitourinary: Negative for difficulty urinating, dysuria, flank pain, frequency, hematuria and urgency.  Musculoskeletal: Negative for arthralgias, back pain, joint swelling, myalgias and neck stiffness.  Skin: Negative for pallor and rash.  Neurological: Negative for dizziness, speech difficulty, weakness and headaches.  Hematological: Negative for adenopathy. Does not bruise/bleed easily.  Psychiatric/Behavioral: Negative for confusion and sleep disturbance. The patient is not nervous/anxious.     PE; Blood pressure 112/69, pulse 63, temperature 98 F (36.7 C), temperature source Oral, resp. rate 16, height 5\' 10"  (1.778 m), weight 189 lb  (85.7 kg), SpO2 97 %. Body mass index is 27.12 kg/m.  Gen: Alert, well appearing.  Patient is oriented to person, place, time, and situation. AFFECT: pleasant, lucid thought and speech. ENT: Ears: EACs clear, normal epithelium.  TMs with good light reflex and landmarks bilaterally.  Eyes: no injection, icteris, swelling, or exudate.  EOMI, PERRLA. Nose: no drainage or turbinate edema/swelling.  No injection or focal lesion.  Mouth: lips without lesion/swelling.  Oral mucosa pink and moist.  Dentition intact and without obvious caries or gingival swelling.  Oropharynx without erythema, exudate, or swelling.  Neck: supple/nontender.  No LAD, mass, or TM.  Carotid pulses 2+ bilaterally, without bruits. CV: RRR, no m/r/g.   LUNGS: CTA bilat, nonlabored resps, good aeration in all lung fields. ABD: soft, NT, ND, BS normal.  No hepatospenomegaly or mass.  No bruits. EXT: no clubbing, cyanosis, or edema.  Musculoskeletal: no joint swelling, erythema, warmth, or tenderness.  ROM of all joints intact. Skin - no sores or suspicious lesions or rashes or color changes Rectal exam: negative without mass, lesions or tenderness, PROSTATE EXAM: smooth and symmetric without nodules or tenderness.   Pertinent labs:  Lab Results  Component Value Date   TSH 1.23 05/09/2017   Lab Results  Component Value Date   WBC 6.0 05/09/2017   HGB 15.4 05/09/2017   HCT 47.0 05/09/2017   MCV 92.6 05/09/2017   PLT 230.0 05/09/2017   Lab Results  Component Value Date   CREATININE 0.88 05/09/2017   BUN 13 05/09/2017   NA 141 05/09/2017   K 4.4 05/09/2017   CL 105 05/09/2017   CO2 32 05/09/2017   Lab Results  Component Value Date   ALT 26 05/09/2017   AST 20 05/09/2017   ALKPHOS 50 05/09/2017   BILITOT 0.6 05/09/2017   Lab Results  Component Value Date   CHOL 216 (H) 05/09/2017   Lab Results  Component Value Date   HDL 43.40 05/09/2017   Lab Results  Component Value Date   LDLCALC 141 (H)  03/25/2016   Lab Results  Component Value Date   TRIG 263.0 (H) 05/09/2017   Lab Results  Component Value Date   CHOLHDL 5 05/09/2017   Lab Results  Component Value Date   PSA 2.27 05/09/2017   PSA 2.69 03/25/2016     ASSESSMENT AND PLAN:   Health maintenance exam: Reviewed age and gender appropriate health maintenance issues (prudent diet, regular exercise, health risks of tobacco and excessive alcohol, use of seatbelts, fire alarms in home, use of sunscreen).  Also reviewed age and gender appropriate health screening as well as vaccine recommendations. Vaccines: Tdap UTD.  Flu vaccine-- "just had it 2 weeks ago.  Shingrix vaccine--discussed (we don't have this vaccine in office  currently). Labs: reviewed in detail all labs done 05/09/17. Prostate ca screening: DRE , PSA recently was normal (and lower than 1 yr ago). Colon ca screening/hx of adenomatous polyp (2009) : overdue for repeat.  I referred him to [facility] in 2017 and they were unable to contact patient to schedule and patient had not provided ther office with previous GI records. Referral was closed.   An After Visit Summary was printed and given to the patient.  FOLLOW UP:  Return in about 6 months (around 11/20/2017) for routine chronic illness f/u.  Signed:  Crissie Sickles, MD           05/22/2017

## 2017-06-09 ENCOUNTER — Other Ambulatory Visit: Payer: Self-pay | Admitting: *Deleted

## 2017-06-09 MED ORDER — ATORVASTATIN CALCIUM 20 MG PO TABS: 20.0000 mg | ORAL_TABLET | Freq: Every day | ORAL | 0 refills | Status: DC

## 2017-06-09 NOTE — Telephone Encounter (Signed)
Requesting 90 day supply.

## 2017-06-13 ENCOUNTER — Encounter: Payer: Self-pay | Admitting: Family Medicine

## 2017-06-25 ENCOUNTER — Telehealth: Payer: Self-pay

## 2017-06-25 NOTE — Telephone Encounter (Signed)
Faxed request for medical records to [facility] 06/25/17 LM

## 2017-07-23 ENCOUNTER — Encounter: Payer: Self-pay | Admitting: Family Medicine

## 2017-07-31 ENCOUNTER — Telehealth: Payer: Self-pay | Admitting: Family Medicine

## 2017-07-31 NOTE — Telephone Encounter (Signed)
Rec'd from [place] forward 12 pages to Dr. Ricardo Jericho MD

## 2017-08-07 ENCOUNTER — Encounter: Payer: Self-pay | Admitting: Family Medicine

## 2017-08-21 ENCOUNTER — Encounter: Payer: Self-pay | Admitting: Family Medicine

## 2017-09-12 ENCOUNTER — Other Ambulatory Visit: Payer: Self-pay | Admitting: Family Medicine

## 2017-10-07 DIAGNOSIS — H10413 Chronic giant papillary conjunctivitis, bilateral: Secondary | ICD-10-CM | POA: Diagnosis not present

## 2017-10-13 ENCOUNTER — Other Ambulatory Visit: Payer: Self-pay | Admitting: Family Medicine

## 2017-10-16 ENCOUNTER — Telehealth: Payer: Self-pay | Admitting: Internal Medicine

## 2017-10-16 NOTE — Telephone Encounter (Signed)
Received colon and path reports. Dr. Hilarie Fredrickson is DOD for 05/22/17 pm, records placed on his desk for review

## 2017-10-17 ENCOUNTER — Encounter: Payer: Self-pay | Admitting: Internal Medicine

## 2017-10-17 NOTE — Telephone Encounter (Signed)
Dr.Pyrtle reviewed records and accepted patient for colon. Patient was called and scheduled.

## 2017-11-20 ENCOUNTER — Ambulatory Visit: Payer: BLUE CROSS/BLUE SHIELD | Admitting: Family Medicine

## 2017-12-02 ENCOUNTER — Other Ambulatory Visit: Payer: Self-pay | Admitting: *Deleted

## 2017-12-02 MED ORDER — LORAZEPAM 0.5 MG PO TABS: 0.5000 mg | ORAL_TABLET | Freq: Every day | ORAL | 1 refills | Status: DC | PRN

## 2017-12-02 NOTE — Telephone Encounter (Signed)
Will approve 1 mo supply, with 1 RF. Pls inform pt of new prescribing rules regarding this kind of med, need to see pt in office minimum of every 6 months when on this med.-thx

## 2017-12-02 NOTE — Telephone Encounter (Signed)
[facility]  RF request for lorazepam LOV: 05/22/17 Next ov: None Last written: 05/02/17 #30 w/ 5RF  Please advise. Thanks.

## 2017-12-03 NOTE — Telephone Encounter (Signed)
Rx faxed

## 2017-12-05 ENCOUNTER — Encounter: Payer: BLUE CROSS/BLUE SHIELD | Admitting: Internal Medicine

## 2017-12-08 NOTE — Telephone Encounter (Signed)
Pt advised and voiced understanding. He stated that he will call back to schedule an apt, he has apt for colonoscopy in June and will call to schedule f/u after that apt.

## 2017-12-09 ENCOUNTER — Other Ambulatory Visit: Payer: Self-pay | Admitting: Family Medicine

## 2017-12-27 DIAGNOSIS — D126 Benign neoplasm of colon, unspecified: Secondary | ICD-10-CM

## 2017-12-27 HISTORY — DX: Benign neoplasm of colon, unspecified: D12.6

## 2017-12-29 ENCOUNTER — Ambulatory Visit (AMBULATORY_SURGERY_CENTER): Payer: Self-pay | Admitting: *Deleted

## 2017-12-29 ENCOUNTER — Other Ambulatory Visit: Payer: Self-pay

## 2017-12-29 VITALS — Ht 70.0 in | Wt 196.0 lb

## 2017-12-29 DIAGNOSIS — Z86010 Personal history of colonic polyps: Secondary | ICD-10-CM

## 2017-12-29 MED ORDER — NA SULFATE-K SULFATE-MG SULF 17.5-3.13-1.6 GM/177ML PO SOLN: ORAL | 0 refills | Status: DC

## 2017-12-29 NOTE — Progress Notes (Signed)
Patient denies any allergies to eggs or soy. Patient denies any problems with anesthesia/sedation. Patient denies any oxygen use at home. Patient denies taking any diet/weight loss medications or blood thinners. EMMI education offered, pt declined. Suprep $15 coupon given to pt.  

## 2018-01-05 ENCOUNTER — Encounter: Payer: Self-pay | Admitting: Internal Medicine

## 2018-01-19 ENCOUNTER — Other Ambulatory Visit: Payer: Self-pay

## 2018-01-19 ENCOUNTER — Ambulatory Visit (AMBULATORY_SURGERY_CENTER): Payer: BLUE CROSS/BLUE SHIELD | Admitting: Internal Medicine

## 2018-01-19 ENCOUNTER — Encounter: Payer: Self-pay | Admitting: Internal Medicine

## 2018-01-19 VITALS — BP 124/74 | HR 60 | Temp 98.9°F | Resp 17 | Ht 70.0 in | Wt 189.0 lb

## 2018-01-19 DIAGNOSIS — Z1211 Encounter for screening for malignant neoplasm of colon: Secondary | ICD-10-CM

## 2018-01-19 DIAGNOSIS — D120 Benign neoplasm of cecum: Secondary | ICD-10-CM

## 2018-01-19 DIAGNOSIS — Z86010 Personal history of colonic polyps: Secondary | ICD-10-CM | POA: Diagnosis not present

## 2018-01-19 DIAGNOSIS — K635 Polyp of colon: Secondary | ICD-10-CM | POA: Diagnosis not present

## 2018-01-19 DIAGNOSIS — K388 Other specified diseases of appendix: Secondary | ICD-10-CM | POA: Diagnosis not present

## 2018-01-19 MED ORDER — SODIUM CHLORIDE 0.9 % IV SOLN: 500.0000 mL | Freq: Once | INTRAVENOUS | Status: DC

## 2018-01-19 NOTE — Patient Instructions (Signed)
  Please read handouts on polyps, diverticulosis, and hemorrhoids.    YOU HAD AN ENDOSCOPIC PROCEDURE TODAY AT [facility]:   Refer to the procedure report that was given to you for any specific questions about what was found during the examination.  If the procedure report does not answer your questions, please call your gastroenterologist to clarify.  If you requested that your care partner not be given the details of your procedure findings, then the procedure report has been included in a sealed envelope for you to review at your convenience later.  YOU SHOULD EXPECT: Some feelings of bloating in the abdomen. Passage of more gas than usual.  Walking can help get rid of the air that was put into your GI tract during the procedure and reduce the bloating. If you had a lower endoscopy (such as a colonoscopy or flexible sigmoidoscopy) you may notice spotting of blood in your stool or on the toilet paper. If you underwent a bowel prep for your procedure, you may not have a normal bowel movement for a few days.  Please Note:  You might notice some irritation and congestion in your nose or some drainage.  This is from the oxygen used during your procedure.  There is no need for concern and it should clear up in a day or so.  SYMPTOMS TO REPORT IMMEDIATELY:   Following lower endoscopy (colonoscopy or flexible sigmoidoscopy):  Excessive amounts of blood in the stool  Significant tenderness or worsening of abdominal pains  Swelling of the abdomen that is new, acute  Fever of 100F or higher    For urgent or emergent issues, a gastroenterologist can be reached at any hour by calling [phone].   DIET:  We do recommend a small meal at first, but then you may proceed to your regular diet.  Drink plenty of fluids but you should avoid alcoholic beverages for 24 hours.  ACTIVITY:  You should plan to take it easy for the rest of today and you should NOT DRIVE or use heavy machinery  until tomorrow (because of the sedation medicines used during the test).    FOLLOW UP: Our staff will call the number listed on your records the next business day following your procedure to check on you and address any questions or concerns that you may have regarding the information given to you following your procedure. If we do not reach you, we will leave a message.  However, if you are feeling well and you are not experiencing any problems, there is no need to return our call.  We will assume that you have returned to your regular daily activities without incident.  If any biopsies were taken you will be contacted by phone or by letter within the next 1-3 weeks.  Please call us at [phone] if you have not heard about the biopsies in 3 weeks.    SIGNATURES/CONFIDENTIALITY: You and/or your care partner have signed paperwork which will be entered into your electronic medical record.  These signatures attest to the fact that that the information above on your After Visit Summary has been reviewed and is understood.  Full responsibility of the confidentiality of this discharge information lies with you and/or your care-partner.

## 2018-01-19 NOTE — Progress Notes (Signed)
Called to room to assist during endoscopic procedure.  Patient ID and intended procedure confirmed with present staff. Received instructions for my participation in the procedure from the performing physician.  

## 2018-01-19 NOTE — Progress Notes (Signed)
A/ox3, pleased with MAC, report to RN 

## 2018-01-19 NOTE — Op Note (Signed)
[facility] Patient Name: Billy Bates Procedure Date: 01/19/2018 9:11 AM MRN: [id number] Endoscopist: Jerene Bears , MD Age: 57 Referring MD:  Date of Birth: 01-30-1961 Gender: Male Account #: [id number] Procedure:                Colonoscopy Indications:              Screening for colorectal malignant neoplasm Medicines:                Monitored Anesthesia Care Procedure:                Pre-Anesthesia Assessment:                           - Prior to the procedure, a History and Physical                            was performed, and patient medications and                            allergies were reviewed. The patient's tolerance of                            previous anesthesia was also reviewed. The risks                            and benefits of the procedure and the sedation                            options and risks were discussed with the patient.                            All questions were answered, and informed consent                            was obtained. Prior Anticoagulants: The patient has                            taken no previous anticoagulant or antiplatelet                            agents. ASA Grade Assessment: II - A patient with                            mild systemic disease. After reviewing the risks                            and benefits, the patient was deemed in                            satisfactory condition to undergo the procedure.                           After obtaining informed consent, the colonoscope  was passed under direct vision. Throughout the                            procedure, the patient's blood pressure, pulse, and                            oxygen saturations were monitored continuously. The                            Model CF-HQ190L [id number]) scope was introduced                            through the anus and advanced to the terminal                            ileum. The colonoscopy  was performed without                            difficulty. The patient tolerated the procedure                            well. The quality of the bowel preparation was good                            after irrigation and lavage. The terminal ileum,                            ileocecal valve, appendiceal orifice, and rectum                            were photographed. Scope In: 9:27:19 AM Scope Out: 9:43:24 AM Scope Withdrawal Time: 0 hours 14 minutes 31 seconds  Total Procedure Duration: 0 hours 16 minutes 5 seconds  Findings:                 The digital rectal exam was normal.                           The terminal ileum appeared normal.                           A 7 mm polyp (size difficult to assess given                            involvement at [facility]) was found in the appendiceal                            orifice. The polyp was sessile. The polyp was                            removed with a cold snare. Polyp resection was                            possibly incomplete. The resected tissue was  retrieved. Additional biopsies with a cold forceps                            was obtained after snare polypectomy to exclude                            residual polyp (likely sessile serrated polyp).                           Multiple small and large-mouthed diverticula were                            found in the sigmoid colon and descending colon.                           Internal hemorrhoids were found during                            retroflexion. The hemorrhoids were small. Complications:            No immediate complications. Estimated Blood Loss:     Estimated blood loss was minimal. Impression:               - The examined portion of the ileum was normal.                           - One 7 mm polyp at the appendiceal orifice,                            removed with a cold snare. Biopsied as above.                           - Mild diverticulosis in the  sigmoid colon and in                            the descending colon.                           - Small internal hemorrhoids. Recommendation:           - Patient has a contact number available for                            emergencies. The signs and symptoms of potential                            delayed complications were discussed with the                            patient. Return to normal activities tomorrow.                            Written discharge instructions were provided to the  patient.                           - Resume previous diet.                           - Continue present medications.                           - Await pathology results.                           - Repeat colonoscopy is recommended. The                            colonoscopy date will be determined after pathology                            results from today's exam become available for                            review. Jerene Bears, MD 01/19/2018 9:52:01 AM This report has been signed electronically.

## 2018-01-20 ENCOUNTER — Telehealth: Payer: Self-pay | Admitting: *Deleted

## 2018-01-20 NOTE — Telephone Encounter (Signed)
No answer, left message to call if questions or concerns. 

## 2018-01-20 NOTE — Telephone Encounter (Signed)
  Follow up Call-  Call back number 01/19/2018  Post procedure Call Back phone  # [phone]  Permission to leave phone message Yes  Some recent data might be hidden     Patient questions:  Do you have a fever, pain , or abdominal swelling? No. Pain Score  0 *  Have you tolerated food without any problems? Yes.    Have you been able to return to your normal activities? Yes.    Do you have any questions about your discharge instructions: Diet   No. Medications  No. Follow up visit  No.  Do you have questions or concerns about your Care? No.  Actions: * If pain score is 4 or above: No action needed, pain <4.

## 2018-01-22 ENCOUNTER — Encounter: Payer: Self-pay | Admitting: Family Medicine

## 2018-01-27 ENCOUNTER — Telehealth: Payer: Self-pay | Admitting: Internal Medicine

## 2018-01-27 NOTE — Telephone Encounter (Signed)
Called Sarah back and left her a message regarding pt being referred for appendectomy for sessile serrated polyp involving the appendiceal orifice.

## 2018-02-06 ENCOUNTER — Encounter: Payer: Self-pay | Admitting: Family Medicine

## 2018-02-18 ENCOUNTER — Encounter: Payer: Self-pay | Admitting: Family Medicine

## 2018-02-18 ENCOUNTER — Ambulatory Visit: Payer: Self-pay | Admitting: General Surgery

## 2018-02-18 DIAGNOSIS — K635 Polyp of colon: Secondary | ICD-10-CM | POA: Diagnosis not present

## 2018-03-13 NOTE — Pre-Procedure Instructions (Signed)
Billy Bates  03/13/2018      [id number] [place], [place] - [address] [place] [address] Phone: [phone] Fax: [phone]    Your procedure is scheduled on August 26  Report to [facility] at 0700 A.M.  Call this number if you have problems the morning of surgery:  [phone]   Remember:  Do not eat after midnight.  You may drink clear liquids until 0600 am .  Clear liquids allowed are:  Water, Carbonated beverages, Black Coffee only and Gatorade   DRINK 2 BOTTLES OF PRE-SURGERY ENSURE THE NIGHT BEFORE SURGERY  DRINK 1 BOTTLE OF PRE-SURGERY ENSURE THE MORNING OF SURGERY BEFORE 0600 AM    Take these medicines the morning of surgery with A SIP OF WATER  LORazepam (ATIVAN) ranitidine (ZANTAC)   7 days prior to surgery STOP taking any Aspirin(unless otherwise instructed by your surgeon), Aleve, Naproxen, Ibuprofen, Motrin, Advil, Goody's, BC's, all herbal medications, fish oil, and all vitamins    Do not wear jewelry\  Do not wear lotions, powders, or cologne, or deodorant.  \ Men may shave face and neck.  Do not bring valuables to the hospital.  [facility] is not responsible for any belongings or valuables.  Contacts, dentures or bridgework may not be worn into surgery.  Leave your suitcase in the car.  After surgery it may be brought to your room.  For patients admitted to the hospital, discharge time will be determined by your treatment team.  Patients discharged the day of surgery will not be allowed to drive home.    Special instructions:   [facility]- Preparing For Surgery  Before surgery, you can play an important role. Because skin is not sterile, your skin needs to be as free of germs as possible. You can reduce the number of germs on your skin by washing with CHG (chlorahexidine gluconate) Soap before surgery.  CHG is an antiseptic cleaner which kills germs and bonds with  the skin to continue killing germs even after washing.    Oral Hygiene is also important to reduce your risk of infection.  Remember - BRUSH YOUR TEETH THE MORNING OF SURGERY WITH YOUR REGULAR TOOTHPASTE  Please do not use if you have an allergy to CHG or antibacterial soaps. If your skin becomes reddened/irritated stop using the CHG.  Do not shave (including legs and underarms) for at least 48 hours prior to first CHG shower. It is OK to shave your face.  Please follow these instructions carefully.   1. Shower the NIGHT BEFORE SURGERY and the MORNING OF SURGERY with CHG.   2. If you chose to wash your hair, wash your hair first as usual with your normal shampoo.  3. After you shampoo, rinse your hair and body thoroughly to remove the shampoo.  4. Use CHG as you would any other liquid soap. You can apply CHG directly to the skin and wash gently with a scrungie or a clean washcloth.   5. Apply the CHG Soap to your body ONLY FROM THE NECK DOWN.  Do not use on open wounds or open sores. Avoid contact with your eyes, ears, mouth and genitals (private parts). Wash Face and genitals (private parts)  with your normal soap.  6. Wash thoroughly, paying special attention to the area where your surgery will be performed.  7. Thoroughly rinse your body with warm water from the neck  down.  8. DO NOT shower/wash with your normal soap after using and rinsing off the CHG Soap.  9. Pat yourself dry with a CLEAN TOWEL.  10. Wear CLEAN PAJAMAS to bed the night before surgery, wear comfortable clothes the morning of surgery  11. Place CLEAN SHEETS on your bed the night of your first shower and DO NOT SLEEP WITH PETS.    Day of Surgery:  Do not apply any deodorants/lotions.  Please wear clean clothes to the hospital/surgery center.   Remember to brush your teeth WITH YOUR REGULAR TOOTHPASTE.    Please read over the following fact sheets that you were given.

## 2018-03-16 ENCOUNTER — Encounter (HOSPITAL_COMMUNITY): Payer: Self-pay

## 2018-03-16 ENCOUNTER — Encounter (HOSPITAL_COMMUNITY)
Admission: RE | Admit: 2018-03-16 | Discharge: 2018-03-16 | Disposition: A | Discharge status: Home / Self Care | Payer: BLUE CROSS/BLUE SHIELD | Source: Ambulatory Visit | Attending: General Surgery | Admitting: General Surgery

## 2018-03-16 ENCOUNTER — Other Ambulatory Visit: Payer: Self-pay

## 2018-03-16 DIAGNOSIS — Z01812 Encounter for preprocedural laboratory examination: Secondary | ICD-10-CM | POA: Insufficient documentation

## 2018-03-16 DIAGNOSIS — I10 Essential (primary) hypertension: Secondary | ICD-10-CM | POA: Insufficient documentation

## 2018-03-16 DIAGNOSIS — Z01810 Encounter for preprocedural cardiovascular examination: Secondary | ICD-10-CM | POA: Diagnosis not present

## 2018-03-16 HISTORY — DX: Gastro-esophageal reflux disease without esophagitis: K21.9

## 2018-03-16 HISTORY — DX: Fibromyalgia: M79.7

## 2018-03-16 LAB — CBC WITH DIFFERENTIAL/PLATELET
Abs Immature Granulocytes: 0 10*3/uL (ref 0.0–0.1)
Basophils Absolute: 0 10*3/uL (ref 0.0–0.1)
Basophils Relative: 0 %
Eosinophils Absolute: 0.1 10*3/uL (ref 0.0–0.7)
Eosinophils Relative: 2 %
HCT: 48.5 % (ref 39.0–52.0)
Hemoglobin: 15.8 g/dL (ref 13.0–17.0)
Immature Granulocytes: 0 %
Lymphocytes Relative: 34 %
Lymphs Abs: 2.3 10*3/uL (ref 0.7–4.0)
MCH: 29.8 pg (ref 26.0–34.0)
MCHC: 32.6 g/dL (ref 30.0–36.0)
MCV: 91.5 fL (ref 78.0–100.0)
Monocytes Absolute: 0.6 10*3/uL (ref 0.1–1.0)
Monocytes Relative: 9 %
Neutro Abs: 3.7 10*3/uL (ref 1.7–7.7)
Neutrophils Relative %: 55 %
Platelets: 253 10*3/uL (ref 150–400)
RBC: 5.3 MIL/uL (ref 4.22–5.81)
RDW: 13 % (ref 11.5–15.5)
WBC: 6.8 10*3/uL (ref 4.0–10.5)

## 2018-03-16 LAB — BASIC METABOLIC PANEL
Anion gap: 7 (ref 5–15)
BUN: 14 mg/dL (ref 6–20)
CO2: 28 mmol/L (ref 22–32)
Calcium: 9.1 mg/dL (ref 8.9–10.3)
Chloride: 105 mmol/L (ref 98–111)
Creatinine, Ser: 0.91 mg/dL (ref 0.61–1.24)
GFR calc Af Amer: >60 mL/min (ref >60)
GFR calc non Af Amer: >60 mL/min (ref >60)
Glucose, Bld: 100 mg/dL — ABNORMAL HIGH (ref 70–99)
Potassium: 4.2 mmol/L (ref 3.5–5.1)
Sodium: 140 mmol/L (ref 135–145)

## 2018-03-16 LAB — HEMOGLOBIN A1C
Hgb A1c MFr Bld: 5.3 % (ref 4.8–5.6)
Mean Plasma Glucose: 105.41 mg/dL

## 2018-03-16 NOTE — Progress Notes (Signed)
REQUESTED STRESS TEST, LAST OV, EKG FROM [place].

## 2018-03-20 MED ORDER — SODIUM CHLORIDE 0.9 % IV SOLN
2.0000 g | INTRAVENOUS | Status: AC
  Administered 2018-03-23: 2 g via INTRAVENOUS
  Filled 2018-03-20: qty 2

## 2018-03-20 MED ORDER — BUPIVACAINE LIPOSOME 1.3 % IJ SUSP
20.0000 mL | INTRAMUSCULAR | Status: DC
  Filled 2018-03-20: qty 20

## 2018-03-23 ENCOUNTER — Encounter (HOSPITAL_COMMUNITY): Payer: Self-pay | Admitting: Certified Registered"

## 2018-03-23 ENCOUNTER — Encounter (HOSPITAL_COMMUNITY)
Admission: RE | Disposition: A | Discharge status: Home / Self Care | Payer: Self-pay | Source: Ambulatory Visit | Attending: General Surgery

## 2018-03-23 ENCOUNTER — Ambulatory Visit (HOSPITAL_COMMUNITY)
Admission: RE | Admit: 2018-03-23 | Discharge: 2018-03-23 | Disposition: A | Discharge status: Home / Self Care | Payer: BLUE CROSS/BLUE SHIELD | Source: Ambulatory Visit | Attending: General Surgery | Admitting: General Surgery

## 2018-03-23 ENCOUNTER — Ambulatory Visit (HOSPITAL_COMMUNITY): Payer: BLUE CROSS/BLUE SHIELD | Admitting: Certified Registered"

## 2018-03-23 DIAGNOSIS — K219 Gastro-esophageal reflux disease without esophagitis: Secondary | ICD-10-CM | POA: Insufficient documentation

## 2018-03-23 DIAGNOSIS — Z79899 Other long term (current) drug therapy: Secondary | ICD-10-CM | POA: Insufficient documentation

## 2018-03-23 DIAGNOSIS — I10 Essential (primary) hypertension: Secondary | ICD-10-CM | POA: Diagnosis not present

## 2018-03-23 DIAGNOSIS — D121 Benign neoplasm of appendix: Secondary | ICD-10-CM | POA: Diagnosis not present

## 2018-03-23 DIAGNOSIS — K380 Hyperplasia of appendix: Secondary | ICD-10-CM | POA: Diagnosis not present

## 2018-03-23 DIAGNOSIS — M797 Fibromyalgia: Secondary | ICD-10-CM | POA: Diagnosis not present

## 2018-03-23 DIAGNOSIS — K388 Other specified diseases of appendix: Secondary | ICD-10-CM | POA: Diagnosis not present

## 2018-03-23 HISTORY — PX: LAPAROSCOPIC APPENDECTOMY: SHX408

## 2018-03-23 SURGERY — APPENDECTOMY, LAPAROSCOPIC
Anesthesia: General | Site: Abdomen

## 2018-03-23 MED ORDER — ACETAMINOPHEN 500 MG PO TABS
1000.0000 mg | ORAL_TABLET | ORAL | Status: AC
  Administered 2018-03-23: 1000 mg via ORAL

## 2018-03-23 MED ORDER — BUPIVACAINE-EPINEPHRINE 0.25% -1:200000 IJ SOLN
INTRAMUSCULAR | Status: DC | PRN
  Administered 2018-03-23: 17 mL

## 2018-03-23 MED ORDER — ROCURONIUM BROMIDE 50 MG/5ML IV SOSY
PREFILLED_SYRINGE | INTRAVENOUS | Status: AC
  Filled 2018-03-23: qty 5

## 2018-03-23 MED ORDER — LIDOCAINE 2% (20 MG/ML) 5 ML SYRINGE
INTRAMUSCULAR | Status: AC
  Filled 2018-03-23: qty 5

## 2018-03-23 MED ORDER — ONDANSETRON HCL 4 MG/2ML IJ SOLN
INTRAMUSCULAR | Status: AC
  Filled 2018-03-23: qty 2

## 2018-03-23 MED ORDER — LIDOCAINE 2% (20 MG/ML) 5 ML SYRINGE
INTRAMUSCULAR | Status: DC | PRN
  Administered 2018-03-23: 80 mg via INTRAVENOUS

## 2018-03-23 MED ORDER — PHENYLEPHRINE 40 MCG/ML (10ML) SYRINGE FOR IV PUSH (FOR BLOOD PRESSURE SUPPORT)
PREFILLED_SYRINGE | INTRAVENOUS | Status: AC
  Filled 2018-03-23: qty 30

## 2018-03-23 MED ORDER — PROPOFOL 10 MG/ML IV BOLUS
INTRAVENOUS | Status: AC
  Filled 2018-03-23: qty 20

## 2018-03-23 MED ORDER — OXYCODONE HCL 5 MG PO TABS
5.0000 mg | ORAL_TABLET | Freq: Once | ORAL | Status: AC | PRN
  Administered 2018-03-23: 5 mg via ORAL

## 2018-03-23 MED ORDER — CHLORHEXIDINE GLUCONATE CLOTH 2 % EX PADS: 6.0000 | MEDICATED_PAD | Freq: Once | CUTANEOUS | Status: DC

## 2018-03-23 MED ORDER — ROCURONIUM BROMIDE 10 MG/ML (PF) SYRINGE
PREFILLED_SYRINGE | INTRAVENOUS | Status: DC | PRN
  Administered 2018-03-23: 40 mg via INTRAVENOUS

## 2018-03-23 MED ORDER — FENTANYL CITRATE (PF) 250 MCG/5ML IJ SOLN
INTRAMUSCULAR | Status: DC | PRN
  Administered 2018-03-23: 100 ug via INTRAVENOUS
  Administered 2018-03-23 (×3): 50 ug via INTRAVENOUS

## 2018-03-23 MED ORDER — PROMETHAZINE HCL 25 MG/ML IJ SOLN: 6.2500 mg | INTRAMUSCULAR | Status: DC | PRN

## 2018-03-23 MED ORDER — BUPIVACAINE-EPINEPHRINE (PF) 0.25% -1:200000 IJ SOLN
INTRAMUSCULAR | Status: AC
  Filled 2018-03-23: qty 30

## 2018-03-23 MED ORDER — SUGAMMADEX SODIUM 200 MG/2ML IV SOLN
INTRAVENOUS | Status: DC | PRN
  Administered 2018-03-23: 160 mg via INTRAVENOUS

## 2018-03-23 MED ORDER — MIDAZOLAM HCL 2 MG/2ML IJ SOLN
INTRAMUSCULAR | Status: AC
  Filled 2018-03-23: qty 2

## 2018-03-23 MED ORDER — GABAPENTIN 300 MG PO CAPS
ORAL_CAPSULE | ORAL | Status: AC
  Filled 2018-03-23: qty 1

## 2018-03-23 MED ORDER — CELECOXIB 200 MG PO CAPS
200.0000 mg | ORAL_CAPSULE | ORAL | Status: AC
  Administered 2018-03-23: 200 mg via ORAL

## 2018-03-23 MED ORDER — FENTANYL CITRATE (PF) 250 MCG/5ML IJ SOLN
INTRAMUSCULAR | Status: AC
  Filled 2018-03-23: qty 5

## 2018-03-23 MED ORDER — PROPOFOL 10 MG/ML IV BOLUS
INTRAVENOUS | Status: DC | PRN
  Administered 2018-03-23: 200 mg via INTRAVENOUS

## 2018-03-23 MED ORDER — HYDROMORPHONE HCL 1 MG/ML IJ SOLN: 0.2500 mg | INTRAMUSCULAR | Status: DC | PRN

## 2018-03-23 MED ORDER — MIDAZOLAM HCL 5 MG/5ML IJ SOLN
INTRAMUSCULAR | Status: DC | PRN
  Administered 2018-03-23: 2 mg via INTRAVENOUS

## 2018-03-23 MED ORDER — SODIUM CHLORIDE 0.9 % IR SOLN
Status: DC | PRN
  Administered 2018-03-23: 1000 mL

## 2018-03-23 MED ORDER — OXYCODONE HCL 5 MG PO TABS: 5.0000 mg | ORAL_TABLET | Freq: Four times a day (QID) | ORAL | 0 refills | Status: DC | PRN

## 2018-03-23 MED ORDER — LACTATED RINGERS IV SOLN
INTRAVENOUS | Status: DC
  Administered 2018-03-23: 08:00:00 via INTRAVENOUS

## 2018-03-23 MED ORDER — CELECOXIB 200 MG PO CAPS
ORAL_CAPSULE | ORAL | Status: AC
  Filled 2018-03-23: qty 1

## 2018-03-23 MED ORDER — DEXAMETHASONE SODIUM PHOSPHATE 10 MG/ML IJ SOLN
INTRAMUSCULAR | Status: DC | PRN
  Administered 2018-03-23: 10 mg via INTRAVENOUS

## 2018-03-23 MED ORDER — GABAPENTIN 300 MG PO CAPS
300.0000 mg | ORAL_CAPSULE | ORAL | Status: AC
  Administered 2018-03-23: 300 mg via ORAL

## 2018-03-23 MED ORDER — GLYCOPYRROLATE PF 0.2 MG/ML IJ SOSY
PREFILLED_SYRINGE | INTRAMUSCULAR | Status: DC | PRN
  Administered 2018-03-23: .2 mg via INTRAVENOUS

## 2018-03-23 MED ORDER — DEXAMETHASONE SODIUM PHOSPHATE 10 MG/ML IJ SOLN
INTRAMUSCULAR | Status: AC
  Filled 2018-03-23: qty 1

## 2018-03-23 MED ORDER — ONDANSETRON HCL 4 MG/2ML IJ SOLN
INTRAMUSCULAR | Status: DC | PRN
  Administered 2018-03-23: 4 mg via INTRAVENOUS

## 2018-03-23 MED ORDER — ACETAMINOPHEN 500 MG PO TABS
ORAL_TABLET | ORAL | Status: AC
  Filled 2018-03-23: qty 2

## 2018-03-23 MED ORDER — OXYCODONE HCL 5 MG PO TABS
ORAL_TABLET | ORAL | Status: AC
  Filled 2018-03-23: qty 1

## 2018-03-23 MED ORDER — OXYCODONE HCL 5 MG/5ML PO SOLN: 5.0000 mg | Freq: Once | ORAL | Status: AC | PRN

## 2018-03-23 MED ORDER — 0.9 % SODIUM CHLORIDE (POUR BTL) OPTIME
TOPICAL | Status: DC | PRN
  Administered 2018-03-23: 1000 mL

## 2018-03-23 MED ORDER — PHENYLEPHRINE 40 MCG/ML (10ML) SYRINGE FOR IV PUSH (FOR BLOOD PRESSURE SUPPORT)
PREFILLED_SYRINGE | INTRAVENOUS | Status: DC | PRN
  Administered 2018-03-23: 40 ug via INTRAVENOUS
  Administered 2018-03-23: 80 ug via INTRAVENOUS

## 2018-03-23 SURGICAL SUPPLY — 44 items
APPLIER CLIP ROT 10 11.4 M/L (STAPLE)
BLADE CLIPPER SURG (BLADE) IMPLANT
CANISTER SUCT 3000ML PPV (MISCELLANEOUS) ×3 IMPLANT
CHLORAPREP W/TINT 26ML (MISCELLANEOUS) ×3 IMPLANT
CLIP APPLIE ROT 10 11.4 M/L (STAPLE) IMPLANT
COVER SURGICAL LIGHT HANDLE (MISCELLANEOUS) ×3 IMPLANT
CUTTER FLEX LINEAR 45M (STAPLE) ×3 IMPLANT
DERMABOND ADVANCED (GAUZE/BANDAGES/DRESSINGS) ×2
DERMABOND ADVANCED .7 DNX12 (GAUZE/BANDAGES/DRESSINGS) ×1 IMPLANT
ELECT REM PT RETURN 9FT ADLT (ELECTROSURGICAL) ×3
ELECTRODE REM PT RTRN 9FT ADLT (ELECTROSURGICAL) ×1 IMPLANT
GLOVE BIO SURGEON STRL SZ8 (GLOVE) ×3 IMPLANT
GLOVE BIOGEL PI IND STRL 8 (GLOVE) ×1 IMPLANT
GLOVE BIOGEL PI INDICATOR 8 (GLOVE) ×2
GOWN STRL REUS W/ TWL LRG LVL3 (GOWN DISPOSABLE) ×2 IMPLANT
GOWN STRL REUS W/ TWL XL LVL3 (GOWN DISPOSABLE) ×1 IMPLANT
GOWN STRL REUS W/TWL LRG LVL3 (GOWN DISPOSABLE) ×4
GOWN STRL REUS W/TWL XL LVL3 (GOWN DISPOSABLE) ×2
KIT BASIN OR (CUSTOM PROCEDURE TRAY) ×3 IMPLANT
KIT TURNOVER KIT B (KITS) ×3 IMPLANT
NEEDLE 22X1 1/2 (OR ONLY) (NEEDLE) ×3 IMPLANT
NS IRRIG 1000ML POUR BTL (IV SOLUTION) ×3 IMPLANT
PAD ARMBOARD 7.5X6 YLW CONV (MISCELLANEOUS) ×6 IMPLANT
POUCH RETRIEVAL ECOSAC 10 (ENDOMECHANICALS) ×1 IMPLANT
POUCH RETRIEVAL ECOSAC 10MM (ENDOMECHANICALS) ×2
RELOAD 45 VASCULAR/THIN (ENDOMECHANICALS) ×3 IMPLANT
RELOAD STAPLE TA45 3.5 REG BLU (ENDOMECHANICALS) IMPLANT
SCISSORS LAP 5X35 DISP (ENDOMECHANICALS) IMPLANT
SET IRRIG TUBING LAPAROSCOPIC (IRRIGATION / IRRIGATOR) ×3 IMPLANT
SHEARS HARMONIC ACE PLUS 36CM (ENDOMECHANICALS) ×3 IMPLANT
SPECIMEN JAR SMALL (MISCELLANEOUS) ×3 IMPLANT
SUT VIC AB 4-0 PS2 27 (SUTURE) ×3 IMPLANT
SUT VICRYL 0 UR6 27IN ABS (SUTURE) ×3 IMPLANT
TOWEL OR 17X24 6PK STRL BLUE (TOWEL DISPOSABLE) ×3 IMPLANT
TOWEL OR 17X26 10 PK STRL BLUE (TOWEL DISPOSABLE) ×3 IMPLANT
TRAY FOLEY CATH SILVER 16FR (SET/KITS/TRAYS/PACK) ×3 IMPLANT
TRAY FOLEY W/BAG SLVR 16FR (SET/KITS/TRAYS/PACK) ×2
TRAY FOLEY W/BAG SLVR 16FR ST (SET/KITS/TRAYS/PACK) ×1 IMPLANT
TRAY LAPAROSCOPIC MC (CUSTOM PROCEDURE TRAY) ×3 IMPLANT
TROCAR XCEL 12X100 BLDLESS (ENDOMECHANICALS) ×3 IMPLANT
TROCAR XCEL BLUNT TIP 100MML (ENDOMECHANICALS) ×3 IMPLANT
TROCAR XCEL NON-BLD 5MMX100MML (ENDOMECHANICALS) ×3 IMPLANT
TUBING INSUFFLATION (TUBING) ×3 IMPLANT
WATER STERILE IRR 1000ML POUR (IV SOLUTION) ×3 IMPLANT

## 2018-03-23 NOTE — Interval H&P Note (Signed)
History and Physical Interval Note:  03/23/2018 8:29 AM  Billy Bates  has presented today for surgery, with the diagnosis of appendix polyp  The various methods of treatment have been discussed with the patient and family. After consideration of risks, benefits and other options for treatment, the patient has consented to  Procedure(s): [place] (N/A) as a surgical intervention .  The patient's history has been reviewed, patient examined, no change in status, stable for surgery.  I have reviewed the patient's chart and labs.  Questions were answered to the patient's satisfaction.     Zenovia Jarred

## 2018-03-23 NOTE — Anesthesia Postprocedure Evaluation (Signed)
Anesthesia Post Note  Patient: Billy Bates  Procedure(s) Performed: LAPAROSCOPIC APPENDECTOMY (N/A Abdomen)     Patient location during evaluation: PACU Anesthesia Type: General Level of consciousness: awake and alert Pain management: pain level controlled Vital Signs Assessment: post-procedure vital signs reviewed and stable Respiratory status: spontaneous breathing, nonlabored ventilation, respiratory function stable and patient connected to nasal cannula oxygen Cardiovascular status: blood pressure returned to baseline and stable Postop Assessment: no apparent nausea or vomiting Anesthetic complications: no    Last Vitals:  Vitals:   03/23/18 1015 03/23/18 1027  BP:  131/88  Pulse: 66 60  Resp: 14 15  Temp: (!) 36.4 C   SpO2: 96% 96%    Last Pain:  Vitals:   03/23/18 1027  TempSrc:   PainSc: 0-No pain                 Ryan P Ellender

## 2018-03-23 NOTE — Anesthesia Preprocedure Evaluation (Addendum)
Anesthesia Evaluation  Patient identified by MRN, date of birth, ID band Patient awake    Reviewed: Allergy & Precautions, NPO status , Patient's Chart, lab work & pertinent test results  Airway Mallampati: II  TM Distance: >3 FB Neck ROM: Full    Dental no notable dental hx.    Pulmonary neg pulmonary ROS,    Pulmonary exam normal breath sounds clear to auscultation       Cardiovascular hypertension, Pt. on medications Normal cardiovascular exam Rhythm:Regular Rate:Normal  ECG: NSR, rate 61   Neuro/Psych negative neurological ROS  negative psych ROS   GI/Hepatic Neg liver ROS, GERD  Controlled,  Endo/Other  negative endocrine ROS  Renal/GU negative Renal ROS     Musculoskeletal  (+) Arthritis , Fibromyalgia -  Abdominal   Peds  Hematology HLD   Anesthesia Other Findings appendix polyp  Reproductive/Obstetrics                            Anesthesia Physical Anesthesia Plan  ASA: II  Anesthesia Plan: General   Post-op Pain Management:    Induction: Intravenous  PONV Risk Score and Plan: 3 and Ondansetron, Dexamethasone, Midazolam and Treatment may vary due to age or medical condition  Airway Management Planned: Oral ETT  Additional Equipment:   Intra-op Plan:   Post-operative Plan: Extubation in OR  Informed Consent: I have reviewed the patients History and Physical, chart, labs and discussed the procedure including the risks, benefits and alternatives for the proposed anesthesia with the patient or authorized representative who has indicated his/her understanding and acceptance.   Dental advisory given  Plan Discussed with: CRNA  Anesthesia Plan Comments:         Anesthesia Quick Evaluation

## 2018-03-23 NOTE — Transfer of Care (Signed)
Immediate Anesthesia Transfer of Care Note  Patient: Billy Bates  Procedure(s) Performed: LAPAROSCOPIC APPENDECTOMY (N/A Abdomen)  Patient Location: PACU  Anesthesia Type:General  Level of Consciousness: awake and alert   Airway & Oxygen Therapy: Patient Spontanous Breathing and Patient connected to nasal cannula oxygen  Post-op Assessment: Report given to RN and Post -op Vital signs reviewed and stable  Post vital signs: Reviewed and stable  Last Vitals:  Vitals Value Taken Time  BP    Temp    Pulse 71 03/23/2018  9:51 AM  Resp 9 03/23/2018  9:51 AM  SpO2 92 % 03/23/2018  9:51 AM  Vitals shown include unvalidated device data.  Last Pain:  Vitals:   03/23/18 0748  TempSrc:   PainSc: 0-No pain      Patients Stated Pain Goal: 2 (64/29/03 7955)  Complications: No apparent anesthesia complications

## 2018-03-23 NOTE — Anesthesia Procedure Notes (Signed)
Procedure Name: Intubation Date/Time: 03/23/2018 9:01 AM Performed by: Imagene Riches, CRNA Pre-anesthesia Checklist: Patient identified, Emergency Drugs available, Suction available and Patient being monitored Patient Re-evaluated:Patient Re-evaluated prior to induction Oxygen Delivery Method: Circle System Utilized Preoxygenation: Pre-oxygenation with 100% oxygen Induction Type: IV induction Ventilation: Mask ventilation without difficulty Laryngoscope Size: Miller and 3 Grade View: Grade II Tube type: Oral Tube size: 7.5 mm Number of attempts: 1 Airway Equipment and Method: Stylet and Oral airway Placement Confirmation: ETT inserted through vocal cords under direct vision,  positive ETCO2 and breath sounds checked- equal and bilateral Secured at: 22 cm Tube secured with: Tape Dental Injury: Teeth and Oropharynx as per pre-operative assessment

## 2018-03-23 NOTE — H&P (Signed)
Billy Bates Billy Bates Documented: 02/18/2018 9:26 AM Location: [facility] Patient #: [id number] DOB: 05-24-1961 Married / Language: Billy Bates / Race: White Male   History of Present Illness Billy Neri E. Billy Silos MD; 02/18/2018 9:43 AM) The patient is a 57 year old male who presents with a colonic polyp. I was asked to see Billy Bates in consultation by Dr. Zenovia Bates for consideration of appendectomy. He underwent a screening colonoscopy on 01/19/18. He was found to have a polyp at his appendiceal orifice. This was a serrated polyp without dysplasia. I was asked to consider extended appendectomy for complete removal. Billy Bates has had no GI symptoms. Normal diet, no nausea, no hematochezia, and normal bowel movements.   Past Surgical History Billy Bates; 02/18/2018 9:27 AM) Colon Polyp Removal - Colonoscopy  Knee Surgery  Bilateral. Oral Surgery  Spinal Surgery - Neck  Tonsillectomy   Diagnostic Studies History Billy Bates; 02/18/2018 9:27 AM) Colonoscopy  within last year  Allergies Billy Bates, Billy Bates; 02/18/2018 9:29 AM) No Known Drug Allergies [02/18/2018]:  Medication History (Billy Bates, Bates; 02/18/2018 9:30 AM) LORazepam (0.5MG  Tablet, Oral) Active. Atorvastatin Calcium (20MG  Tablet, Oral) Active. Lisinopril (10MG  Tablet, Oral) Active. Multivitamin Adult (Oral) Active. Medications Reconciled  Social History Billy Bates; 02/18/2018 9:27 AM) Alcohol use  Moderate alcohol use. Caffeine use  Coffee. No drug use  Tobacco use  Never smoker.  Family History Billy Bates, [place]; 02/18/2018 9:27 AM) Arthritis  Brother, Father. Breast Cancer  Mother. Cerebrovascular Accident  Father, Mother. Heart Disease  Mother. Heart disease in male family member before age 42  Hypertension  Brother, Father, Mother. Melanoma  Father, Sister. Prostate Cancer  Father.  Other Problems (Billy Bates, Bates; 02/18/2018 9:27 AM) Arthritis   Back Pain  Hemorrhoids  High blood pressure     Review of Systems (Billy Bates Bates; 02/18/2018 9:27 AM) General Not Present- Appetite Loss, Chills, Fatigue, Fever, Night Sweats, Weight Gain and Weight Loss. Skin Not Present- Change in Wart/Mole, Dryness, Hives, Jaundice, New Lesions, Non-Healing Wounds, Rash and Ulcer. HEENT Present- Seasonal Allergies and Wears glasses/contact lenses. Not Present- Earache, Hearing Loss, Hoarseness, Nose Bleed, Oral Ulcers, Ringing in the Ears, Sinus Pain, Sore Throat, Visual Disturbances and Yellow Eyes. Respiratory Not Present- Bloody sputum, Chronic Cough, Difficulty Breathing, Snoring and Wheezing. Breast Not Present- Breast Mass, Breast Pain, Nipple Discharge and Skin Changes. Cardiovascular Not Present- Chest Pain, Difficulty Breathing Lying Down, Leg Cramps, Palpitations, Rapid Heart Rate, Shortness of Breath and Swelling of Extremities. Gastrointestinal Present- Hemorrhoids and Indigestion. Not Present- Abdominal Pain, Bloating, Bloody Stool, Change in Bowel Habits, Chronic diarrhea, Constipation, Difficulty Swallowing, Excessive gas, Gets full quickly at meals, Nausea, Rectal Pain and Vomiting. Male Genitourinary Not Present- Blood in Urine, Change in Urinary Stream, Frequency, Impotence, Nocturia, Painful Urination, Urgency and Urine Leakage. Musculoskeletal Present- Back Pain and Joint Pain. Not Present- Joint Stiffness, Muscle Pain, Muscle Weakness and Swelling of Extremities. Neurological Present- Numbness and Tingling. Not Present- Decreased Memory, Fainting, Headaches, Seizures, Tremor, Trouble walking and Weakness. Psychiatric Not Present- Anxiety, Bipolar, Change in Sleep Pattern, Depression, Fearful and Frequent crying. Endocrine Not Present- Cold Intolerance, Excessive Hunger, Hair Changes, Heat Intolerance, Hot flashes and New Diabetes. Hematology Not Present- Blood Thinners, Easy Bruising, Excessive bleeding, Gland problems, HIV and  Persistent Infections.  Vitals (Billy Bates Bates; 02/18/2018 9:29 AM) 02/18/2018 9:28 AM Weight: 196.44 lb Height: 70in Body Surface Area: 2.07 m Body Mass Index: 28.19 kg/m  Temp.: 97.69F  Pulse: 78 (Regular)  P.OX: 96% (Room air) BP:  130/84 (Sitting, Left Arm, Standard)       Physical Exam Billy Neri E. Billy Silos MD; 02/18/2018 9:43 AM) General Mental Status-Alert. General Appearance-Consistent with stated age. Hydration-Well hydrated. Voice-Normal.  Head and Neck Head-normocephalic, atraumatic with no lesions or palpable masses. Trachea-midline. Thyroid Gland Characteristics - normal size and consistency.  Eye Eyeball - Bilateral-Extraocular movements intact. Sclera/Conjunctiva - Bilateral-No scleral icterus.  Chest and Lung Exam Chest and lung exam reveals -quiet, even and easy respiratory effort with no use of accessory muscles and on auscultation, normal breath sounds, no adventitious sounds and normal vocal resonance. Inspection Chest Wall - Normal. Back - normal.  Cardiovascular Cardiovascular examination reveals -normal heart sounds, regular rate and rhythm with no murmurs and normal pedal pulses bilaterally.  Abdomen Inspection Inspection of the abdomen reveals - No Hernias. Palpation/Percussion Palpation and Percussion of the abdomen reveal - Soft, Non Tender, No Rebound tenderness, No Rigidity (guarding) and No hepatosplenomegaly. Auscultation Auscultation of the abdomen reveals - Bowel sounds normal.  Neurologic Neurologic evaluation reveals -alert and oriented x 3 with no impairment of recent or remote memory. Mental Status-Normal.  Musculoskeletal Global Assessment -Note: no gross deformities.  Normal Exam - Left-Upper Extremity Strength Normal and Lower Extremity Strength Normal. Normal Exam - Right-Upper Extremity Strength Normal and Lower Extremity Strength Normal.  Lymphatic Head & Neck  General Head  & Neck Lymphatics: Bilateral - Description - Normal. Axillary  General Axillary Region: Bilateral - Description - Normal. Tenderness - Non Tender. Femoral & Inguinal  Generalized Femoral & Inguinal Lymphatics: Bilateral - Description - No Generalized lymphadenopathy.    Assessment & Plan Billy Neri E. Billy Silos MD; 02/18/2018 9:45 AM) COLON POLYP (K63.5) Impression: This involves the appendix at the orifice. I recommend laparoscopic extended appendectomy for complete removal and prevention of the development of appendicitis. I discussed the procedure, risks, and benefits with him. I also discussed expected postoperative course. I answered his questions and he is agreeable. Plan ERAS pathway.

## 2018-03-23 NOTE — Op Note (Addendum)
03/23/2018  9:48 AM  PATIENT:  Billy Bates  57 y.o. male  PRE-OPERATIVE DIAGNOSIS:  APPENDICEAL POLYP  POST-OPERATIVE DIAGNOSIS:  APPENDICEAL POLYP  PROCEDURE:  Procedure(s): LAPAROSCOPIC EXTENDED APPENDECTOMY INCLUDING BASE OF CECUM  SURGEON:  Surgeon(s): Georganna Skeans, MD  ASSISTANTS: none   ANESTHESIA:   local and general  EBL:  Total I/O In: -  Out: 310 [Urine:300; Blood:10]  BLOOD ADMINISTERED:none  DRAINS: none   SPECIMEN:  Excision  DISPOSITION OF SPECIMEN:  PATHOLOGY  COUNTS:  YES  DICTATION: .Dragon Dictation Findings: fullness at base of appendix, no inflammation  Procedure in detail: It was identified the preop holding area.  Informed consent was obtained.  He received intravenous antibiotics.  He was brought to the operating room and general endotracheal anesthesia was administered by the anesthesia staff.  His abdomen was prepped and draped in a sterile fashion.  Timeout procedure was performed.The infraumbilical region was infiltrated with local. Infraumbilical incision was made. Subcutaneous tissues were dissected down revealing the anterior fascia. This was divided sharply along the midline. Peritoneal cavity was entered under direct vision without complication. A 0 Vicryl pursestring was placed around the fascial opening. Hassan trocar was inserted into the abdomen. The abdomen was insufflated with carbon dioxide in standard fashion. Under direct vision a 12 mm left lower quadrant and a 5 mm right abdominal port were placed.  Local was used at each port site.  Laparoscopic exploration revealed the cecum.  It was a bit adherent to the lateral abdominal sidewall and it was gently mobilized using the harmonic scalpel.  The base and remainder of the appendix were then visualized.  The mesoappendix was divided with the harmonic scalpel achieving excellent hemostasis.  I then mobilized the base of the cecum as well from lateral peritoneal attachments.  The  terminal ileum was identified and I stayed away from that area.  There was a fullness right at the appendiceal orifice palpable laparoscopically.  This was consistent with a polyp on colonoscopy at the appendiceal orifice.  I then divided the end of the cecum where the appendix attached using an Endo GIA with a vascular load taking the end of the cecum as well for margin..  There was excellent staple line closure.  Hemostasis was ensured.  The area was irrigated.  The appendix specimen was placed in a bag and removed from the abdomen.  It was sent to pathology.  Hemostasis was again ensured.  Ports were then removed under direct vision.  Pneumoperitoneum was released.  Infraumbilical fascia was closed by tying the pursestring.  I then placed an additional 0 Vicryl interrupted suture to support the closure of the infraumbilical fascia.  All 3 wounds were irrigated and skin of each was closed with 4-0 Vicryl followed by Dermabond.  All counts were correct.  He tolerated the procedure well without apparent complication and was taken recovery in stable condition. PATIENT DISPOSITION:  PACU - hemodynamically stable.   Delay start of Pharmacological VTE agent (>24hrs) due to surgical blood loss or risk of bleeding:  no  Georganna Skeans, MD, MPH, FACS Pager: [phone]  8/26/20199:48 AM

## 2018-03-24 ENCOUNTER — Encounter (HOSPITAL_COMMUNITY): Payer: Self-pay | Admitting: General Surgery

## 2018-04-08 ENCOUNTER — Other Ambulatory Visit: Payer: Self-pay | Admitting: Family Medicine

## 2018-04-08 NOTE — Telephone Encounter (Signed)
Pt due for follow up, will send Rx for #90 w/ 0RF.  Needs office visit for follow up for more refills.

## 2018-04-15 NOTE — Telephone Encounter (Signed)
Pt advised and voiced understanding.    Apt made for 04/21/18 at 8:45am.

## 2018-04-21 ENCOUNTER — Encounter: Payer: Self-pay | Admitting: Family Medicine

## 2018-04-21 ENCOUNTER — Ambulatory Visit (INDEPENDENT_AMBULATORY_CARE_PROVIDER_SITE_OTHER): Payer: BLUE CROSS/BLUE SHIELD | Admitting: Family Medicine

## 2018-04-21 VITALS — BP 118/82 | HR 72 | Temp 98.6°F | Resp 16 | Ht 70.0 in | Wt 196.0 lb

## 2018-04-21 DIAGNOSIS — E663 Overweight: Secondary | ICD-10-CM

## 2018-04-21 DIAGNOSIS — I10 Essential (primary) hypertension: Secondary | ICD-10-CM | POA: Diagnosis not present

## 2018-04-21 DIAGNOSIS — F5101 Primary insomnia: Secondary | ICD-10-CM | POA: Diagnosis not present

## 2018-04-21 DIAGNOSIS — Z23 Encounter for immunization: Secondary | ICD-10-CM

## 2018-04-21 DIAGNOSIS — E7800 Pure hypercholesterolemia, unspecified: Secondary | ICD-10-CM | POA: Diagnosis not present

## 2018-04-21 DIAGNOSIS — Z9889 Other specified postprocedural states: Secondary | ICD-10-CM

## 2018-04-21 DIAGNOSIS — M25561 Pain in right knee: Secondary | ICD-10-CM

## 2018-04-21 DIAGNOSIS — G8929 Other chronic pain: Secondary | ICD-10-CM

## 2018-04-21 DIAGNOSIS — M25562 Pain in left knee: Secondary | ICD-10-CM

## 2018-04-21 LAB — HEPATIC FUNCTION PANEL
ALT: 24 U/L (ref 0–53)
AST: 15 U/L (ref 0–37)
Albumin: 4.1 g/dL (ref 3.5–5.2)
Alkaline Phosphatase: 55 U/L (ref 39–117)
Bilirubin, Direct: 0.1 mg/dL (ref 0.0–0.3)
Total Bilirubin: 0.5 mg/dL (ref 0.2–1.2)
Total Protein: 6.7 g/dL (ref 6.0–8.3)

## 2018-04-21 LAB — LIPID PANEL
Cholesterol: 202 mg/dL — ABNORMAL HIGH (ref 0–200)
HDL: 46.5 mg/dL (ref >39.00)
LDL Cholesterol: 122 mg/dL — ABNORMAL HIGH (ref 0–99)
NonHDL: 155.27
Total CHOL/HDL Ratio: 4
Triglycerides: 168 mg/dL — ABNORMAL HIGH (ref 0.0–149.0)
VLDL: 33.6 mg/dL (ref 0.0–40.0)

## 2018-04-21 NOTE — Progress Notes (Signed)
OFFICE VISIT  04/21/2018   CC:  Chief Complaint  Patient presents with  . Follow-up    RCI, pt is fasting.     HPI:    Patient is a 57 y.o.  male who presents for f/u HTN, HLD, and insomnia Doing well but still getting energy back after getting lap appendectomy 02/2018.  HTN;  occ bp check at home is normal.  Compliant with meds.   HLD: pt started atorva 20mg  qd in 04/2017, due for repeat FLP and hepatic panel.  Compliant with this med w/out side effect.  Insomnia: takes lorazepam a few times a month, sometimes less--"just when I have problems sleeping". This has been a good sleep aid for him--no side effects. Does not use this for anxiety.   Past Medical History:  Diagnosis Date  . Adenomatous colon polyp 12/2017   sessile serrated polyp at appendiceal orifice, not completely removed at endoscopy.  Pt had extended lap appendectomy to make sure entire polyp removed..  . Arthritis    neck/knees  . Elevated PSA    Repeat 1 mo later was back down.  No bx.  No urology referral.  . Family history of prostate cancer    Father  . Fibromyalgia    ???  . GERD (gastroesophageal reflux disease)   . History of colitis    Lymphocytic colitis; episodic flares (most recent approx 2015)  . Hyperlipidemia    borderline--controlled with TLC.  2018 [facility] 10 yr CV risk = 8.1%: atorva started.  . Hypertension   . Insomnia   . Kidney laceration    Hx of, 1980  . Organic impotence   . Splenic laceration    Hx of, 1980    Past Surgical History:  Procedure Laterality Date  . CERVICAL FUSION  approx 1999   C5-6  . COLONOSCOPY  12/2006; 01/2009; 12/2017   [facility] in [place], [place]--Dr. Ann Lions:  2008 (rectal bleeding)-Nonadenomatous polyp: rpt 10 yrs rec'd.  2010: RLQ pain and diarrhea--lymphocytic colitis.  Ileal polyp-benign, diverticulosis.  Recall 5 yrs per GI MD notes.  01/19/18 1 sess serr polyp at appendiceal orifice--incompletely removed.  GI referred him to  gen surg for extended appendectomy.  Recall 12/2020.  [name] KNEE SURGERY     Ex baseball player.  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 LAPAROSCOPIC APPENDECTOMY N/A 03/23/2018   Procedure: LAPAROSCOPIC APPENDECTOMY;  Surgeon: Georganna Skeans, MD;  Location: [place];  Service: General;  Laterality: N/A;  . [place]    Outpatient Medications Prior to Visit  Medication Sig Dispense Refill  . acetaminophen (TYLENOL) 500 MG tablet Take 1,500-2,000 mg by mouth daily as needed for moderate pain or headache.    [name] atorvastatin (LIPITOR) 20 MG tablet TAKE 1 TABLET (20 MG TOTAL) DAILY BY MOUTH. 90 tablet 0  . calcium carbonate (TUMS - DOSED IN MG ELEMENTAL CALCIUM) 500 MG chewable tablet Chew 2-3 tablets by mouth daily as needed for indigestion or heartburn.    [name] lisinopril (PRINIVIL,ZESTRIL) 10 MG tablet Take 1 tablet (10 mg total) by mouth daily. OFFICE VISIT NEEDED 90 tablet 0  . LORazepam (ATIVAN) 0.5 MG tablet Take 1 tablet (0.5 mg total) by mouth daily as needed. (Patient taking differently: Take 0.5 mg by mouth daily as needed for anxiety. ) 30 tablet 1  . Multiple Vitamin (MULTIVITAMIN) tablet Take 1 tablet by mouth daily.    . ranitidine (ZANTAC) 150 MG tablet Take 150 mg by mouth daily as  needed for heartburn.    . Na Sulfate-K Sulfate-Mg Sulf 17.5-3.13-1.6 GM/177ML SOLN Suprep (no substitutions)-TAKE AS DIRECTED. (Patient not taking: Reported on 03/10/2018) 354 mL 0  . oxyCODONE (OXY IR/ROXICODONE) 5 MG immediate release tablet Take 1 tablet (5 mg total) by mouth every 6 (six) hours as needed for severe pain. (Patient not taking: Reported on 04/21/2018) 20 tablet 0   Facility-Administered Medications Prior to Visit  Medication Dose Route Frequency Provider Last Rate Last Dose  . 0.9 %  sodium chloride infusion  500 mL Intravenous Once Pyrtle, Lajuan Lines, MD        No Known Allergies  ROS As per HPI  PE: Blood pressure 118/82, pulse 72, temperature 98.6 F (37 C),  temperature source Oral, resp. rate 16, height 5\' 10"  (1.778 m), weight 196 lb (88.9 kg), SpO2 96 %. Body mass index is 28.12 kg/m.  Gen: Alert, well appearing.  Patient is oriented to person, place, time, and situation. AFFECT: pleasant, lucid thought and speech. CV: RRR, no m/r/g.   LUNGS: CTA bilat, nonlabored resps, good aeration in all lung fields. EXT: no clubbing or cyanosis.  no edema.    LABS:    Chemistry      Component Value Date/Time   NA 140 03/16/2018 0935   K 4.2 03/16/2018 0935   CL 105 03/16/2018 0935   CO2 28 03/16/2018 0935   BUN 14 03/16/2018 0935   CREATININE 0.91 03/16/2018 0935      Component Value Date/Time   CALCIUM 9.1 03/16/2018 0935   ALKPHOS 50 05/09/2017 0815   AST 20 05/09/2017 0815   ALT 26 05/09/2017 0815   BILITOT 0.6 05/09/2017 0815     Lab Results  Component Value Date   CHOL 216 (H) 05/09/2017   HDL 43.40 05/09/2017   LDLCALC 141 (H) 03/25/2016   LDLDIRECT 110.0 05/09/2017   TRIG 263.0 (H) 05/09/2017   CHOLHDL 5 05/09/2017   Lab Results  Component Value Date   HGBA1C 5.3 03/16/2018    IMPRESSION AND PLAN:  1) Hypercholesterolemia: tolerating statin, due for first recheck of lipid pane and hepatic panel since getting on statin. Labs today.  2) HTN: The current medical regimen is effective;  continue present plan and medications. Lytes/cr about 1 mo ago normal.  3) Insomnia: rarely uses lorazepam for this.  I only rx him about 30 pills per year on average. No changes.  4) Bilat chronic knee pain + hx of multiple bilat knee surgeries: at the end of our visit today pt requested referral to an orthopedist to address this issue. Referral to Dr. French Ana ordered.  An After Visit Summary was printed and given to the patient.  FOLLOW UP: Return in about 6 months (around 10/20/2018) for annual CPE (fasting).  Signed:  Crissie Sickles, MD           04/21/2018

## 2018-04-22 ENCOUNTER — Other Ambulatory Visit: Payer: Self-pay | Admitting: Family Medicine

## 2018-04-22 MED ORDER — ATORVASTATIN CALCIUM 40 MG PO TABS: 40.0000 mg | ORAL_TABLET | Freq: Every day | ORAL | 5 refills | Status: DC

## 2018-04-28 DIAGNOSIS — M25561 Pain in right knee: Secondary | ICD-10-CM | POA: Diagnosis not present

## 2018-04-28 DIAGNOSIS — M25562 Pain in left knee: Secondary | ICD-10-CM | POA: Diagnosis not present

## 2018-05-01 ENCOUNTER — Encounter: Payer: Self-pay | Admitting: Family Medicine

## 2018-06-14 ENCOUNTER — Other Ambulatory Visit: Payer: Self-pay | Admitting: Family Medicine

## 2018-06-23 DIAGNOSIS — M79645 Pain in left finger(s): Secondary | ICD-10-CM | POA: Diagnosis not present

## 2018-06-23 DIAGNOSIS — M79644 Pain in right finger(s): Secondary | ICD-10-CM | POA: Diagnosis not present

## 2018-07-02 ENCOUNTER — Other Ambulatory Visit: Payer: Self-pay | Admitting: *Deleted

## 2018-07-02 MED ORDER — LISINOPRIL 10 MG PO TABS: 10.0000 mg | ORAL_TABLET | Freq: Every day | ORAL | 1 refills | Status: DC

## 2018-10-21 ENCOUNTER — Encounter: Payer: BLUE CROSS/BLUE SHIELD | Admitting: Family Medicine

## 2018-12-27 ENCOUNTER — Other Ambulatory Visit: Payer: Self-pay | Admitting: Family Medicine

## 2018-12-31 ENCOUNTER — Other Ambulatory Visit: Payer: Self-pay

## 2018-12-31 MED ORDER — ATORVASTATIN CALCIUM 40 MG PO TABS: 40.0000 mg | ORAL_TABLET | Freq: Every day | ORAL | 0 refills | Status: DC

## 2019-02-23 ENCOUNTER — Ambulatory Visit (INDEPENDENT_AMBULATORY_CARE_PROVIDER_SITE_OTHER): Payer: BC Managed Care – PPO | Admitting: Family Medicine

## 2019-02-23 ENCOUNTER — Encounter: Payer: Self-pay | Admitting: Family Medicine

## 2019-02-23 ENCOUNTER — Other Ambulatory Visit: Payer: Self-pay

## 2019-02-23 VITALS — BP 132/81 | HR 75 | Temp 98.3°F | Resp 16 | Ht 70.0 in | Wt 196.8 lb

## 2019-02-23 DIAGNOSIS — Z0000 Encounter for general adult medical examination without abnormal findings: Secondary | ICD-10-CM | POA: Diagnosis not present

## 2019-02-23 DIAGNOSIS — Z125 Encounter for screening for malignant neoplasm of prostate: Secondary | ICD-10-CM | POA: Diagnosis not present

## 2019-02-23 DIAGNOSIS — Z23 Encounter for immunization: Secondary | ICD-10-CM

## 2019-02-23 DIAGNOSIS — I10 Essential (primary) hypertension: Secondary | ICD-10-CM | POA: Diagnosis not present

## 2019-02-23 DIAGNOSIS — E7800 Pure hypercholesterolemia, unspecified: Secondary | ICD-10-CM

## 2019-02-23 DIAGNOSIS — E663 Overweight: Secondary | ICD-10-CM

## 2019-02-23 LAB — CBC WITH DIFFERENTIAL/PLATELET
Basophils Absolute: 0 10*3/uL (ref 0.0–0.1)
Basophils Relative: 0.3 % (ref 0.0–3.0)
Eosinophils Absolute: 0.1 10*3/uL (ref 0.0–0.7)
Eosinophils Relative: 1.4 % (ref 0.0–5.0)
HCT: 48.4 % (ref 39.0–52.0)
Hemoglobin: 15.9 g/dL (ref 13.0–17.0)
Lymphocytes Relative: 26.9 % (ref 12.0–46.0)
Lymphs Abs: 1.8 10*3/uL (ref 0.7–4.0)
MCHC: 32.9 g/dL (ref 30.0–36.0)
MCV: 91.7 fl (ref 78.0–100.0)
Monocytes Absolute: 0.5 10*3/uL (ref 0.1–1.0)
Monocytes Relative: 7.4 % (ref 3.0–12.0)
Neutro Abs: 4.4 10*3/uL (ref 1.4–7.7)
Neutrophils Relative %: 64 % (ref 43.0–77.0)
Platelets: 228 10*3/uL (ref 150.0–400.0)
RBC: 5.28 Mil/uL (ref 4.22–5.81)
RDW: 14.1 % (ref 11.5–15.5)
WBC: 6.9 10*3/uL (ref 4.0–10.5)

## 2019-02-23 LAB — COMPREHENSIVE METABOLIC PANEL
ALT: 32 U/L (ref 0–53)
AST: 25 U/L (ref 0–37)
Albumin: 4.4 g/dL (ref 3.5–5.2)
Alkaline Phosphatase: 63 U/L (ref 39–117)
BUN: 13 mg/dL (ref 6–23)
CO2: 29 mEq/L (ref 19–32)
Calcium: 9.8 mg/dL (ref 8.4–10.5)
Chloride: 104 mEq/L (ref 96–112)
Creatinine, Ser: 0.84 mg/dL (ref 0.40–1.50)
GFR: 93.42 mL/min (ref >60.00)
Glucose, Bld: 108 mg/dL — ABNORMAL HIGH (ref 70–99)
Potassium: 4.8 mEq/L (ref 3.5–5.1)
Sodium: 139 mEq/L (ref 135–145)
Total Bilirubin: 0.7 mg/dL (ref 0.2–1.2)
Total Protein: 7 g/dL (ref 6.0–8.3)

## 2019-02-23 LAB — LIPID PANEL
Cholesterol: 153 mg/dL (ref 0–200)
HDL: 53.4 mg/dL (ref >39.00)
LDL Cholesterol: 70 mg/dL (ref 0–99)
NonHDL: 99.46
Total CHOL/HDL Ratio: 3
Triglycerides: 146 mg/dL (ref 0.0–149.0)
VLDL: 29.2 mg/dL (ref 0.0–40.0)

## 2019-02-23 LAB — PSA: PSA: 2.72 ng/mL (ref 0.10–4.00)

## 2019-02-23 LAB — TSH: TSH: 1.54 u[IU]/mL (ref 0.35–4.50)

## 2019-02-23 MED ORDER — LORAZEPAM 0.5 MG PO TABS: 0.5000 mg | ORAL_TABLET | Freq: Every day | ORAL | 1 refills | Status: DC | PRN

## 2019-02-23 NOTE — Progress Notes (Signed)
Office Note 02/23/2019  CC:  Chief Complaint  Patient presents with  . Annual Exam    pt is fasting    HPI:  Billy Bates is a 58 y.o. White male who is here for annual health maintenance exam. Doing well, no acute complaints.  Insomnia: takes lorazepam 0.5mg , 1-2 qhs prn, uses infrequently.  About 1 use per week at the most. Never uses this med for anxiety.  Review of PMP AWARE today shows last fill of this med was 12/03/17, #30, rx'd by me.  No suspicious activity.   Past Medical History:  Diagnosis Date  . Adenomatous colon polyp 12/2017   sessile serrated polyp at appendiceal orifice, not completely removed at endoscopy.  Pt had extended lap appendectomy to make sure entire polyp removed..  . Elevated PSA    Repeat 1 mo later was back down.  No bx.  No urology referral.  . Family history of prostate cancer    Father  . Fibromyalgia    ???  . GERD (gastroesophageal reflux disease)   . History of colitis    Lymphocytic colitis; episodic flares (most recent approx 2015)  . Hyperlipidemia    borderline--controlled with TLC.  2018 Framingham 10 yr CV risk = 8.1%: atorva started.  . Hypertension   . Insomnia   . Kidney laceration    Hx of, 1980  . Organic impotence   . Osteoarthritis, multiple sites    neck/knees.  L knee tricompartmental bone on bone.  R knee mild to moderate osteoarthritis (Dr. French Ana)  . Splenic laceration    Hx of, 1980    Past Surgical History:  Procedure Laterality Date  . CERVICAL FUSION  approx 1999   C5-6  . COLONOSCOPY  12/2006; 01/2009; 12/2017   [facility] in [place], [place]--Dr. Ann Lions:  2008 (rectal bleeding)-Nonadenomatous polyp: rpt 10 yrs rec'd.  2010: RLQ pain and diarrhea--lymphocytic colitis.  Ileal polyp-benign, diverticulosis.  Recall 5 yrs per GI MD notes.  01/19/18 1 sess serr polyp at appendiceal orifice--incompletely removed.  GI referred him to gen surg for extended appendectomy.  Recall 12/2020.  [name] KNEE  SURGERY     Ex baseball player.  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 LAPAROSCOPIC APPENDECTOMY N/A 03/23/2018   Procedure: LAPAROSCOPIC APPENDECTOMY;  Surgeon: Georganna Skeans, MD;  Location: [facility];  Service: General;  Laterality: N/A;  . TONSILLECTOMY AND ADENOIDECTOMY  1968    Family History  Problem Relation Age of Onset  . Breast cancer Mother   . Heart disease Mother   . Arthritis Father   . Prostate cancer Father   . Stroke Father   . Hypertension Father   . Diabetes Neg Hx   . Colon cancer Neg Hx     Social History   Socioeconomic History  . Marital status: Married    Spouse name: Not on file  . Number of children: Not on file  . Years of education: Not on file  . Highest education level: Not on file  Occupational History  . Not on file  Social Needs  . Financial resource strain: Not on file  . Food insecurity    Worry: Not on file    Inability: Not on file  . Transportation needs    Medical: Not on file    Non-medical: Not on file  Tobacco Use  . Smoking status: Never Smoker  . Smokeless tobacco: Former Systems developer    Types: Snuff  Substance and Sexual Activity  .  Alcohol use: Yes    Alcohol/week: 12.0 standard drinks    Types: 12 Cans of beer per week  . Drug use: No  . Sexual activity: Not on file  Lifestyle  . Physical activity    Days per week: Not on file    Minutes per session: Not on file  . Stress: Not on file  Relationships  . Social Herbalist on phone: Not on file    Gets together: Not on file    Attends religious service: Not on file    Active member of club or organization: Not on file    Attends meetings of clubs or organizations: Not on file    Relationship status: Not on file  . Intimate partner violence    Fear of current or ex partner: Not on file    Emotionally abused: Not on file    Physically abused: Not on file    Forced sexual activity: Not on file  Other Topics Concern  . Not on file  Social History  Narrative   Married, 2 daughters.   Educ: BS   Occupation: Freight forwarder for [employer]      No [place] or drugs.  Occ alcohol.    Outpatient Medications Prior to Visit  Medication Sig Dispense Refill  . acetaminophen (TYLENOL) 500 MG tablet Take 1,500-2,000 mg by mouth daily as needed for moderate pain or headache.    [name] atorvastatin (LIPITOR) 40 MG tablet Take 1 tablet (40 mg total) by mouth daily. 90 tablet 0  . calcium carbonate (TUMS - DOSED IN MG ELEMENTAL CALCIUM) 500 MG chewable tablet Chew 2-3 tablets by mouth daily as needed for indigestion or heartburn.    [name] lisinopril (ZESTRIL) 10 MG tablet TAKE 1 TABLET BY MOUTH EVERY DAY 90 tablet 0  . Multiple Vitamin (MULTIVITAMIN) tablet Take 1 tablet by mouth daily.    [name] LORazepam (ATIVAN) 0.5 MG tablet Take 1 tablet (0.5 mg total) by mouth daily as needed. (Patient not taking: Reported on 02/23/2019) 30 tablet 1  . Na Sulfate-K Sulfate-Mg Sulf 17.5-3.13-1.6 GM/177ML SOLN Suprep (no substitutions)-TAKE AS DIRECTED. (Patient not taking: Reported on 03/10/2018) 354 mL 0  . oxyCODONE (OXY IR/ROXICODONE) 5 MG immediate release tablet Take 1 tablet (5 mg total) by mouth every 6 (six) hours as needed for severe pain. (Patient not taking: Reported on 04/21/2018) 20 tablet 0  . ranitidine (ZANTAC) 150 MG tablet Take 150 mg by mouth daily as needed for heartburn.    [name] 0.9 %  sodium chloride infusion      No facility-administered medications prior to visit.     No Known Allergies  ROS Review of Systems  Constitutional: Negative for appetite change, chills, fatigue and fever.  HENT: Negative for congestion, dental problem, ear pain and sore throat.   Eyes: Negative for discharge, redness and visual disturbance.  Respiratory: Negative for cough, chest tightness, shortness of breath and wheezing.   Cardiovascular: Negative for chest pain, palpitations and leg swelling.  Gastrointestinal: Negative for abdominal pain, blood in stool, diarrhea, nausea and vomiting.   Genitourinary: Negative for difficulty urinating, dysuria, flank pain, frequency, hematuria and urgency.  Musculoskeletal: Negative for arthralgias, back pain, joint swelling, myalgias and neck stiffness.  Skin: Negative for pallor and rash.  Neurological: Negative for dizziness, speech difficulty, weakness and headaches.  Hematological: Negative for adenopathy. Does not bruise/bleed easily.  Psychiatric/Behavioral: Negative for confusion and sleep disturbance. The patient is not nervous/anxious.     PE; Blood pressure  132/81, pulse 75, temperature 98.3 F (36.8 C), temperature source Temporal, resp. rate 16, height 5\' 10"  (1.778 m), weight 196 lb 12.8 oz (89.3 kg), SpO2 97 %. Gen: Alert, well appearing.  Patient is oriented to person, place, time, and situation. AFFECT: pleasant, lucid thought and speech. ENT: Ears: EACs clear, normal epithelium.  TMs with good light reflex and landmarks bilaterally.  Eyes: no injection, icteris, swelling, or exudate.  EOMI, PERRLA. Nose: no drainage or turbinate edema/swelling.  No injection or focal lesion.  Mouth: lips without lesion/swelling.  Oral mucosa pink and moist.  Dentition intact and without obvious caries or gingival swelling.  Oropharynx without erythema, exudate, or swelling.  Neck: supple/nontender.  No LAD, mass, or TM.  Carotid pulses 2+ bilaterally, without bruits. CV: RRR, no m/r/g.   LUNGS: CTA bilat, nonlabored resps, good aeration in all lung fields. ABD: soft, NT, ND, BS normal.  No hepatospenomegaly or mass.  No bruits. EXT: no clubbing, cyanosis, or edema.  Musculoskeletal: no joint swelling, erythema, warmth, or tenderness.  ROM of all joints intact. Skin - no sores or suspicious lesions or rashes or color changes  Pertinent labs:  Lab Results  Component Value Date   TSH 1.23 05/09/2017   Lab Results  Component Value Date   WBC 6.8 03/16/2018   HGB 15.8 03/16/2018   HCT 48.5 03/16/2018   MCV 91.5 03/16/2018   PLT 253  03/16/2018   Lab Results  Component Value Date   CREATININE 0.91 03/16/2018   BUN 14 03/16/2018   NA 140 03/16/2018   K 4.2 03/16/2018   CL 105 03/16/2018   CO2 28 03/16/2018   Lab Results  Component Value Date   ALT 24 04/21/2018   AST 15 04/21/2018   ALKPHOS 55 04/21/2018   BILITOT 0.5 04/21/2018   Lab Results  Component Value Date   CHOL 202 (H) 04/21/2018   Lab Results  Component Value Date   HDL 46.50 04/21/2018   Lab Results  Component Value Date   LDLCALC 122 (H) 04/21/2018   Lab Results  Component Value Date   TRIG 168.0 (H) 04/21/2018   Lab Results  Component Value Date   CHOLHDL 4 04/21/2018   Lab Results  Component Value Date   PSA 2.27 05/09/2017   PSA 2.69 03/25/2016   Lab Results  Component Value Date   HGBA1C 5.3 03/16/2018    ASSESSMENT AND PLAN:   Health maintenance exam: Reviewed age and gender appropriate health maintenance issues (prudent diet, regular exercise, health risks of tobacco and excessive alcohol, use of seatbelts, fire alarms in home, use of sunscreen).  Also reviewed age and gender appropriate health screening as well as vaccine recommendations. Vaccines: all UTD.  Shingrix discussed--#1 given today. Labs: fasting HP and PSA. Prostate ca screening:  PSA today. Colon ca screening: next colonoscopy due 12/2020.  Insomnia: infrequent use of lorazepam.  I sent in rx for #30 of the 0.5mg  tabs with 1 RF.  An After Visit Summary was printed and given to the patient.  FOLLOW UP:  Return in about 6 months (around 08/26/2019) for routine chronic illness f/u.  Signed:  Crissie Sickles, MD           02/23/2019

## 2019-02-23 NOTE — Addendum Note (Signed)
Addended by: Ralph Dowdy on: 02/23/2019 09:13 AM   Modules accepted: Orders

## 2019-02-23 NOTE — Patient Instructions (Signed)

## 2019-02-23 NOTE — Progress Notes (Signed)
Shingrix

## 2019-02-24 ENCOUNTER — Telehealth: Payer: Self-pay

## 2019-02-24 ENCOUNTER — Encounter: Payer: Self-pay | Admitting: Family Medicine

## 2019-02-24 ENCOUNTER — Other Ambulatory Visit (INDEPENDENT_AMBULATORY_CARE_PROVIDER_SITE_OTHER): Payer: BC Managed Care – PPO

## 2019-02-24 DIAGNOSIS — R7301 Impaired fasting glucose: Secondary | ICD-10-CM | POA: Diagnosis not present

## 2019-02-24 DIAGNOSIS — M170 Bilateral primary osteoarthritis of knee: Secondary | ICD-10-CM | POA: Diagnosis not present

## 2019-02-24 DIAGNOSIS — R200 Anesthesia of skin: Secondary | ICD-10-CM | POA: Diagnosis not present

## 2019-02-24 LAB — HEMOGLOBIN A1C: Hgb A1c MFr Bld: 5.6 % (ref 4.6–6.5)

## 2019-02-24 NOTE — Telephone Encounter (Signed)
MyChart message sent with results.  

## 2019-03-15 DIAGNOSIS — M170 Bilateral primary osteoarthritis of knee: Secondary | ICD-10-CM | POA: Diagnosis not present

## 2019-03-21 ENCOUNTER — Other Ambulatory Visit: Payer: Self-pay | Admitting: Family Medicine

## 2019-03-24 ENCOUNTER — Other Ambulatory Visit: Payer: Self-pay | Admitting: Family Medicine

## 2019-03-29 DIAGNOSIS — M1711 Unilateral primary osteoarthritis, right knee: Secondary | ICD-10-CM | POA: Diagnosis not present

## 2019-03-29 DIAGNOSIS — M1712 Unilateral primary osteoarthritis, left knee: Secondary | ICD-10-CM | POA: Diagnosis not present

## 2019-04-08 DIAGNOSIS — M170 Bilateral primary osteoarthritis of knee: Secondary | ICD-10-CM | POA: Diagnosis not present

## 2019-05-21 ENCOUNTER — Ambulatory Visit: Payer: BC Managed Care – PPO | Admitting: Family Medicine

## 2019-05-27 ENCOUNTER — Ambulatory Visit (INDEPENDENT_AMBULATORY_CARE_PROVIDER_SITE_OTHER): Payer: BC Managed Care – PPO | Admitting: Family Medicine

## 2019-05-27 ENCOUNTER — Encounter: Payer: Self-pay | Admitting: Family Medicine

## 2019-05-27 ENCOUNTER — Other Ambulatory Visit: Payer: Self-pay

## 2019-05-27 VITALS — BP 135/83 | HR 71 | Temp 98.3°F | Resp 16 | Ht 70.0 in | Wt 204.2 lb

## 2019-05-27 DIAGNOSIS — Z23 Encounter for immunization: Secondary | ICD-10-CM

## 2019-05-27 DIAGNOSIS — R1032 Left lower quadrant pain: Secondary | ICD-10-CM

## 2019-05-27 NOTE — Progress Notes (Signed)
OFFICE VISIT  05/27/2019   CC:  Chief Complaint  Patient presents with  . Groin Pain    burning. standing and moving hurts. left side. several months   HPI:    Patient is a 58 y.o. Caucasian male who presents for "hernia". Three-four mo hx of L groin burning sensation, only feels it when he is in process of sitting from standing and vice versa.  Sitting still and also walking doesn't cause the pain.  Pain intensity sounds variable, not more than moderate intensity.  Got better for a few weeks then returned.   Recalls no injury prior to the pain.  He says it is mildly TTP but no bulge is noted.   Past Medical History:  Diagnosis Date  . Adenomatous colon polyp 12/2017   sessile serrated polyp at appendiceal orifice, not completely removed at endoscopy.  Pt had extended lap appendectomy to make sure entire polyp removed..  . Elevated PSA    Repeat 1 mo later was back down.  No bx.  No urology referral.  . Family history of prostate cancer    Father  . Fibromyalgia    ???  . GERD (gastroesophageal reflux disease)   . History of colitis    Lymphocytic colitis; episodic flares (most recent approx 2015)  . Hyperlipidemia    borderline--controlled with TLC.  2018 Framingham 10 yr CV risk = 8.1%: atorva started.  . Hypertension   . IFG (impaired fasting glucose)    A1c consistently <6%  . Insomnia   . Kidney laceration    Hx of, 1980  . Organic impotence   . Osteoarthritis, multiple sites    neck/knees.  L knee tricompartmental bone on bone.  R knee mild to moderate osteoarthritis (Dr. French Ana)  . Splenic laceration    Hx of, 1980    Past Surgical History:  Procedure Laterality Date  . CERVICAL FUSION  approx 1999   C5-6  . COLONOSCOPY  12/2006; 01/2009; 12/2017   [facility] in [place], [place]--Dr. Ann Lions:  2008 (rectal bleeding)-Nonadenomatous polyp: rpt 10 yrs rec'd.  2010: RLQ pain and diarrhea--lymphocytic colitis.  Ileal polyp-benign, diverticulosis.   Recall 5 yrs per GI MD notes.  01/19/18 1 sess serr polyp at appendiceal orifice--incompletely removed.  GI referred him to gen surg for extended appendectomy.  Recall 12/2020.  [name] KNEE SURGERY     Ex baseball player.  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 LAPAROSCOPIC APPENDECTOMY N/A 03/23/2018   Procedure: LAPAROSCOPIC APPENDECTOMY;  Surgeon: Georganna Skeans, MD;  Location: [facility];  Service: General;  Laterality: N/A;  . [place]    Outpatient Medications Prior to Visit  Medication Sig Dispense Refill  . acetaminophen (TYLENOL) 500 MG tablet Take 1,500-2,000 mg by mouth daily as needed for moderate pain or headache.    [name] atorvastatin (LIPITOR) 40 MG tablet TAKE 1 TABLET BY MOUTH EVERY DAY 90 tablet 1  . calcium carbonate (TUMS - DOSED IN MG ELEMENTAL CALCIUM) 500 MG chewable tablet Chew 2-3 tablets by mouth daily as needed for indigestion or heartburn.    [name] lisinopril (ZESTRIL) 10 MG tablet TAKE 1 TABLET BY MOUTH EVERY DAY 90 tablet 1  . LORazepam (ATIVAN) 0.5 MG tablet Take 1 tablet (0.5 mg total) by mouth daily as needed. 30 tablet 1  . Multiple Vitamin (MULTIVITAMIN) tablet Take 1 tablet by mouth daily.     No facility-administered medications prior to visit.     No Known Allergies  ROS As per HPI  PE: Blood pressure 135/83, pulse 71, temperature 98.3 F (36.8 C), temperature source Temporal, resp. rate 16, height 5\' 10"  (1.778 m), weight 204 lb 4 oz (92.6 kg), SpO2 98 %. Gen: Alert, well appearing.  Patient is oriented to person, place, time, and situation. AFFECT: pleasant, lucid thought and speech. GU: no groin swelling/bulge/nodule/rash. Hip ROM fully intact, mild groin pain with ext rot L hip.  Mild TTP in L groin region. Testicles and penis normal, no hernia or tenderness. No hip joint tenderness. ABD: soft, NT/ND, BS normal.  No bruit or mass or HSM.  LABS:  Lab Results  Component Value Date   PSA 2.72 02/23/2019   PSA 2.27  05/09/2017   PSA 2.69 03/25/2016      Chemistry      Component Value Date/Time   NA 139 02/23/2019 0828   K 4.8 02/23/2019 0828   CL 104 02/23/2019 0828   CO2 29 02/23/2019 0828   BUN 13 02/23/2019 0828   CREATININE 0.84 02/23/2019 0828      Component Value Date/Time   CALCIUM 9.8 02/23/2019 0828   ALKPHOS 63 02/23/2019 0828   AST 25 02/23/2019 0828   ALT 32 02/23/2019 0828   BILITOT 0.7 02/23/2019 0828     Lab Results  Component Value Date   WBC 6.9 02/23/2019   HGB 15.9 02/23/2019   HCT 48.4 02/23/2019   MCV 91.7 02/23/2019   PLT 228.0 02/23/2019   Lab Results  Component Value Date   HGBA1C 5.6 02/24/2019    IMPRESSION AND PLAN:  1) Left groin pain, suspect mild strain that still needs home stretching/rehab/time. He already takes daily antiinflamm med for his knees and has an orthopedist he gets regular f/u with. I gave general recommendations regarding home stretching, relative rest. He got shingrix #2 and flu vaccine here today.  Signs/symptoms to call or return for were reviewed and pt expressed understanding.  An After Visit Summary was printed and given to the patient.  FOLLOW UP: Return if symptoms worsen or fail to improve.  Signed:  Crissie Sickles, MD           05/27/2019

## 2019-05-27 NOTE — Addendum Note (Signed)
Addended by: Marlene Bast A on: 05/27/2019 09:54 AM   Modules accepted: Orders

## 2019-05-31 DIAGNOSIS — M170 Bilateral primary osteoarthritis of knee: Secondary | ICD-10-CM | POA: Diagnosis not present

## 2019-06-03 ENCOUNTER — Ambulatory Visit: Payer: BC Managed Care – PPO

## 2019-07-19 DIAGNOSIS — M170 Bilateral primary osteoarthritis of knee: Secondary | ICD-10-CM | POA: Diagnosis not present

## 2019-08-26 ENCOUNTER — Ambulatory Visit: Payer: BC Managed Care – PPO | Admitting: Family Medicine

## 2019-08-30 ENCOUNTER — Ambulatory Visit (INDEPENDENT_AMBULATORY_CARE_PROVIDER_SITE_OTHER): Payer: BC Managed Care – PPO | Admitting: Family Medicine

## 2019-08-30 ENCOUNTER — Other Ambulatory Visit: Payer: Self-pay

## 2019-08-30 ENCOUNTER — Encounter: Payer: Self-pay | Admitting: Family Medicine

## 2019-08-30 VITALS — BP 130/84 | HR 66 | Temp 98.1°F | Resp 16 | Ht 70.0 in | Wt 204.8 lb

## 2019-08-30 DIAGNOSIS — I10 Essential (primary) hypertension: Secondary | ICD-10-CM

## 2019-08-30 DIAGNOSIS — Z01818 Encounter for other preprocedural examination: Secondary | ICD-10-CM

## 2019-08-30 DIAGNOSIS — E7800 Pure hypercholesterolemia, unspecified: Secondary | ICD-10-CM

## 2019-08-30 DIAGNOSIS — R7301 Impaired fasting glucose: Secondary | ICD-10-CM | POA: Diagnosis not present

## 2019-08-30 NOTE — Progress Notes (Signed)
Office Note 08/30/2019  CC:  Chief Complaint  Patient presents with  . Follow-up    RCI, pt is fasting    HPI:  Billy Bates is a 59 y.o. White male who is pre-surgical clearance for upcoming R knee TKA set for 09/15/18.  He will be getting L TKA a little later this year as well.  Asymptomatic today. Exercise capacity: no limitation except for knee pain.  Stress today from work, may have affected his bp today.  No home monitoring to compare to.  No hx of severe systemic illness, CAD, CHF, cardiac valvular disorder, arrhythmia, or pulm htn.  He takes tylenol prn pain, rx meds listed below-->pt compliant.   Past Medical History:  Diagnosis Date  . Adenomatous colon polyp 12/2017   sessile serrated polyp at appendiceal orifice, not completely removed at endoscopy.  Pt had extended lap appendectomy to make sure entire polyp removed..  . Elevated PSA    Repeat 1 mo later was back down.  No bx.  No urology referral.  . Family history of prostate cancer    Father  . Fibromyalgia    ???  . GERD (gastroesophageal reflux disease)   . History of colitis    Lymphocytic colitis; episodic flares (most recent approx 2015)  . Hyperlipidemia    borderline--controlled with TLC.  2018 Framingham 10 yr CV risk = 8.1%: atorva started.  . Hypertension   . IFG (impaired fasting glucose)    A1c consistently <6%  . Insomnia   . Kidney laceration    Hx of, 1980  . Organic impotence   . Osteoarthritis, multiple sites    neck/knees.  L knee tricompartmental bone on bone.  R knee mild to moderate osteoarthritis (Dr. French Ana)  . Splenic laceration    Hx of, 1980    Past Surgical History:  Procedure Laterality Date  . CERVICAL FUSION  approx 1999   C5-6  . COLONOSCOPY  12/2006; 01/2009; 12/2017   [facility] in [place], [place]--Dr. Ann Lions:  2008 (rectal bleeding)-Nonadenomatous polyp: rpt 10 yrs rec'd.  2010: RLQ pain and diarrhea--lymphocytic colitis.  Ileal polyp-benign,  diverticulosis.  Recall 5 yrs per GI MD notes.  01/19/18 1 sess serr polyp at appendiceal orifice--incompletely removed.  GI referred him to gen surg for extended appendectomy.  Recall 12/2020.  [name] KNEE SURGERY     Ex baseball player.  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 LAPAROSCOPIC APPENDECTOMY N/A 03/23/2018   Procedure: LAPAROSCOPIC APPENDECTOMY;  Surgeon: Georganna Skeans, MD;  Location: [facility];  Service: General;  Laterality: N/A;  . TONSILLECTOMY AND ADENOIDECTOMY  1968    Family History  Problem Relation Age of Onset  . Breast cancer Mother   . Heart disease Mother   . Arthritis Father   . Prostate cancer Father   . Stroke Father   . Hypertension Father   . Diabetes Neg Hx   . Colon cancer Neg Hx     Social History   Socioeconomic History  . Marital status: Married    Spouse name: Not on file  . Number of children: Not on file  . Years of education: Not on file  . Highest education level: Not on file  Occupational History  . Not on file  Tobacco Use  . Smoking status: Never Smoker  . Smokeless tobacco: Former Systems developer    Types: Snuff  Substance and Sexual Activity  . Alcohol use: Yes    Alcohol/week: 12.0 standard drinks  Types: 12 Cans of beer per week  . Drug use: No  . Sexual activity: Not on file  Other Topics Concern  . Not on file  Social History Narrative   Married, 2 daughters.   Educ: BS   Occupation: Freight forwarder for [employer]      No [place] or drugs.  Occ alcohol.   Social Determinants of Health   Financial Resource Strain:   . Difficulty of Paying Living Expenses: Not on file  Food Insecurity:   . Worried About Charity fundraiser in the Last Year: Not on file  . Ran Out of Food in the Last Year: Not on file  Transportation Needs:   . Lack of Transportation (Medical): Not on file  . Lack of Transportation (Non-Medical): Not on file  Physical Activity:   . Days of Exercise per Week: Not on file  . Minutes of Exercise per Session: Not on file   Stress:   . Feeling of Stress : Not on file  Social Connections:   . Frequency of Communication with Friends and Family: Not on file  . Frequency of Social Gatherings with Friends and Family: Not on file  . Attends Religious Services: Not on file  . Active Member of Clubs or Organizations: Not on file  . Attends Archivist Meetings: Not on file  . Marital Status: Not on file  Intimate Partner Violence:   . Fear of Current or Ex-Partner: Not on file  . Emotionally Abused: Not on file  . Physically Abused: Not on file  . Sexually Abused: Not on file    Outpatient Medications Prior to Visit  Medication Sig Dispense Refill  . acetaminophen (TYLENOL) 500 MG tablet Take 1,500-2,000 mg by mouth daily as needed for moderate pain or headache.    [name] atorvastatin (LIPITOR) 40 MG tablet TAKE 1 TABLET BY MOUTH EVERY DAY 90 tablet 1  . calcium carbonate (TUMS - DOSED IN MG ELEMENTAL CALCIUM) 500 MG chewable tablet Chew 2-3 tablets by mouth daily as needed for indigestion or heartburn.    [name] lisinopril (ZESTRIL) 10 MG tablet TAKE 1 TABLET BY MOUTH EVERY DAY 90 tablet 1  . LORazepam (ATIVAN) 0.5 MG tablet Take 1 tablet (0.5 mg total) by mouth daily as needed. 30 tablet 1  . Multiple Vitamin (MULTIVITAMIN) tablet Take 1 tablet by mouth daily.     No facility-administered medications prior to visit.    No Known Allergies  ROS Review of Systems  Constitutional: Negative for appetite change, chills, fatigue and fever.  HENT: Negative for congestion, dental problem, ear pain and sore throat.   Eyes: Negative for discharge, redness and visual disturbance.  Respiratory: Negative for cough, chest tightness, shortness of breath and wheezing.   Cardiovascular: Negative for chest pain, palpitations and leg swelling.  Gastrointestinal: Negative for abdominal pain, blood in stool, diarrhea, nausea and vomiting.  Genitourinary: Negative for difficulty urinating, dysuria, flank pain, frequency,  hematuria and urgency.  Musculoskeletal: Positive for arthralgias (both knees, chronic). Negative for back pain, joint swelling, myalgias and neck stiffness.  Skin: Negative for pallor and rash.  Neurological: Negative for dizziness, speech difficulty, weakness and headaches. Tremors: .  Hematological: Negative for adenopathy. Does not bruise/bleed easily.  Psychiatric/Behavioral: Negative for confusion and sleep disturbance. The patient is not nervous/anxious.     PE;  Repeat bp at end of visit 130/84 Blood pressure (!) 145/91, pulse 66, temperature 98.1 F (36.7 C), temperature source Temporal, resp. rate 16, height 5\' 10"  (1.778  m), weight 204 lb 12.8 oz (92.9 kg), SpO2 98 %. Body mass index is 29.39 kg/m.  Gen: Alert, well appearing.  Patient is oriented to person, place, time, and situation. AFFECT: pleasant, lucid thought and speech. ENT: Ears: EACs clear, normal epithelium.  TMs with good light reflex and landmarks bilaterally.  Eyes: no injection, icteris, swelling, or exudate.  EOMI, PERRLA. Nose: no drainage or turbinate edema/swelling.  No injection or focal lesion.  Mouth: lips without lesion/swelling.  Oral mucosa pink and moist.  Dentition intact and without obvious caries or gingival swelling.  Oropharynx without erythema, exudate, or swelling.  Neck: supple/nontender.  No LAD, mass, or TM.  Carotid pulses 2+ bilaterally, without bruits. CV: RRR, no m/r/g.   LUNGS: CTA bilat, nonlabored resps, good aeration in all lung fields. ABD: soft, NT, ND, BS normal.  No hepatospenomegaly or mass.  No bruits. EXT: no clubbing, cyanosis, or edema.  Musculoskeletal: no joint swelling, erythema, warmth, or tenderness.  ROM of all joints intact. Skin - no sores or suspicious lesions or rashes or color changes   Pertinent labs:  Lab Results  Component Value Date   TSH 1.54 02/23/2019   Lab Results  Component Value Date   WBC 6.9 02/23/2019   HGB 15.9 02/23/2019   HCT 48.4 02/23/2019    MCV 91.7 02/23/2019   PLT 228.0 02/23/2019   Lab Results  Component Value Date   CREATININE 0.84 02/23/2019   BUN 13 02/23/2019   NA 139 02/23/2019   K 4.8 02/23/2019   CL 104 02/23/2019   CO2 29 02/23/2019   Lab Results  Component Value Date   ALT 32 02/23/2019   AST 25 02/23/2019   ALKPHOS 63 02/23/2019   BILITOT 0.7 02/23/2019   Lab Results  Component Value Date   CHOL 153 02/23/2019   Lab Results  Component Value Date   HDL 53.40 02/23/2019   Lab Results  Component Value Date   LDLCALC 70 02/23/2019   Lab Results  Component Value Date   TRIG 146.0 02/23/2019   Lab Results  Component Value Date   CHOLHDL 3 02/23/2019   Lab Results  Component Value Date   PSA 2.72 02/23/2019   PSA 2.27 05/09/2017   PSA 2.69 03/25/2016   Lab Results  Component Value Date   HGBA1C 5.6 02/24/2019   ASSESSMENT AND PLAN:   Preoperative clearance for TKA later this month. No indication for any pre-surg cardiopulmonary testing. Medical problems optimally controlled.  CBC w/diff, CMET, A1c, FLP, PT/INR (presurg clearance, HTN, IFG, HLD).  FOLLOW UP:  Return in about 6 months (around 02/27/2020) for annual CPE (fasting).  An After Visit Summary was printed and given to the patient.

## 2019-08-30 NOTE — Addendum Note (Signed)
Addended by: Deveron Furlong D on: 08/30/2019 03:59 PM   Modules accepted: Orders

## 2019-08-30 NOTE — Addendum Note (Signed)
Addended by: Deveron Furlong D on: 08/30/2019 03:54 PM   Modules accepted: Orders

## 2019-08-31 LAB — COMPREHENSIVE METABOLIC PANEL
AG Ratio: 1.6 (calc) (ref 1.0–2.5)
ALT: 41 U/L (ref 9–46)
AST: 28 U/L (ref 10–35)
Albumin: 4.6 g/dL (ref 3.6–5.1)
Alkaline phosphatase (APISO): 63 U/L (ref 35–144)
BUN: 10 mg/dL (ref 7–25)
CO2: 27 mmol/L (ref 20–32)
Calcium: 10.1 mg/dL (ref 8.6–10.3)
Chloride: 103 mmol/L (ref 98–110)
Creat: 0.84 mg/dL (ref 0.70–1.33)
Globulin: 2.9 g/dL (calc) (ref 1.9–3.7)
Glucose, Bld: 94 mg/dL (ref 65–99)
Potassium: 4 mmol/L (ref 3.5–5.3)
Sodium: 141 mmol/L (ref 135–146)
Total Bilirubin: 0.8 mg/dL (ref 0.2–1.2)
Total Protein: 7.5 g/dL (ref 6.1–8.1)

## 2019-08-31 LAB — CBC WITH DIFFERENTIAL/PLATELET
Absolute Monocytes: 647 cells/uL (ref 200–950)
Basophils Absolute: 23 cells/uL (ref 0–200)
Basophils Relative: 0.3 %
Eosinophils Absolute: 70 cells/uL (ref 15–500)
Eosinophils Relative: 0.9 %
HCT: 47.5 % (ref 38.5–50.0)
Hemoglobin: 16.3 g/dL (ref 13.2–17.1)
Lymphs Abs: 2519 cells/uL (ref 850–3900)
MCH: 30.5 pg (ref 27.0–33.0)
MCHC: 34.3 g/dL (ref 32.0–36.0)
MCV: 88.8 fL (ref 80.0–100.0)
MPV: 11.9 fL (ref 7.5–12.5)
Monocytes Relative: 8.3 %
Neutro Abs: 4540 cells/uL (ref 1500–7800)
Neutrophils Relative %: 58.2 %
Platelets: 254 10*3/uL (ref 140–400)
RBC: 5.35 10*6/uL (ref 4.20–5.80)
RDW: 12.9 % (ref 11.0–15.0)
Total Lymphocyte: 32.3 %
WBC: 7.8 10*3/uL (ref 3.8–10.8)

## 2019-08-31 LAB — HEMOGLOBIN A1C
Hgb A1c MFr Bld: 5.3 % of total Hgb (ref <5.7)
Mean Plasma Glucose: 105 (calc)
eAG (mmol/L): 5.8 (calc)

## 2019-08-31 LAB — PROTIME-INR
INR: 1
Prothrombin Time: 10.8 s (ref 9.0–11.5)

## 2019-09-08 DIAGNOSIS — M170 Bilateral primary osteoarthritis of knee: Secondary | ICD-10-CM | POA: Diagnosis not present

## 2019-09-20 ENCOUNTER — Other Ambulatory Visit: Payer: Self-pay

## 2019-09-20 MED ORDER — LISINOPRIL 10 MG PO TABS: 10.0000 mg | ORAL_TABLET | Freq: Every day | ORAL | 1 refills | Status: DC

## 2019-09-29 DIAGNOSIS — M1711 Unilateral primary osteoarthritis, right knee: Secondary | ICD-10-CM | POA: Diagnosis not present

## 2019-10-07 ENCOUNTER — Other Ambulatory Visit: Payer: Self-pay

## 2019-10-07 MED ORDER — ATORVASTATIN CALCIUM 40 MG PO TABS: 40.0000 mg | ORAL_TABLET | Freq: Every day | ORAL | 0 refills | Status: DC

## 2019-10-11 ENCOUNTER — Ambulatory Visit (HOSPITAL_COMMUNITY)
Admission: RE | Admit: 2019-10-11 | Discharge: 2019-10-11 | Disposition: A | Discharge status: Home / Self Care | Payer: BC Managed Care – PPO | Source: Ambulatory Visit | Attending: Surgery | Admitting: Surgery

## 2019-10-11 ENCOUNTER — Other Ambulatory Visit (HOSPITAL_COMMUNITY): Payer: Self-pay | Admitting: Orthopaedic Surgery

## 2019-10-11 ENCOUNTER — Other Ambulatory Visit: Payer: Self-pay

## 2019-10-11 DIAGNOSIS — M79604 Pain in right leg: Secondary | ICD-10-CM | POA: Diagnosis not present

## 2019-10-25 DIAGNOSIS — Z471 Aftercare following joint replacement surgery: Secondary | ICD-10-CM | POA: Diagnosis not present

## 2019-11-08 ENCOUNTER — Other Ambulatory Visit: Payer: Self-pay | Admitting: Family Medicine

## 2019-11-08 NOTE — Telephone Encounter (Signed)
Requesting: Lorazepam Contract: n/a UDS: n/a Last Visit:08/30/19 Next Visit:11/17/19 Last Refill:02/23/19(30,1)  Please Advise. Medication pending

## 2019-11-09 NOTE — Telephone Encounter (Signed)
Refill sent.

## 2019-11-17 ENCOUNTER — Ambulatory Visit (INDEPENDENT_AMBULATORY_CARE_PROVIDER_SITE_OTHER): Payer: BC Managed Care – PPO | Admitting: Family Medicine

## 2019-11-17 ENCOUNTER — Encounter: Payer: Self-pay | Admitting: Family Medicine

## 2019-11-17 ENCOUNTER — Other Ambulatory Visit: Payer: Self-pay

## 2019-11-17 VITALS — BP 126/89 | HR 83 | Temp 97.6°F | Resp 16 | Ht 70.0 in | Wt 198.0 lb

## 2019-11-17 DIAGNOSIS — I10 Essential (primary) hypertension: Secondary | ICD-10-CM

## 2019-11-17 DIAGNOSIS — E7800 Pure hypercholesterolemia, unspecified: Secondary | ICD-10-CM | POA: Diagnosis not present

## 2019-11-17 DIAGNOSIS — Z125 Encounter for screening for malignant neoplasm of prostate: Secondary | ICD-10-CM | POA: Diagnosis not present

## 2019-11-17 DIAGNOSIS — Z01818 Encounter for other preprocedural examination: Secondary | ICD-10-CM

## 2019-11-17 DIAGNOSIS — R7301 Impaired fasting glucose: Secondary | ICD-10-CM

## 2019-11-17 LAB — PROTIME-INR
INR: 1 ratio (ref 0.8–1.0)
Prothrombin Time: 10.7 s (ref 9.6–13.1)

## 2019-11-17 LAB — COMPREHENSIVE METABOLIC PANEL
ALT: 23 U/L (ref 0–53)
AST: 19 U/L (ref 0–37)
Albumin: 4.6 g/dL (ref 3.5–5.2)
Alkaline Phosphatase: 73 U/L (ref 39–117)
BUN: 13 mg/dL (ref 6–23)
CO2: 29 mEq/L (ref 19–32)
Calcium: 10.1 mg/dL (ref 8.4–10.5)
Chloride: 102 mEq/L (ref 96–112)
Creatinine, Ser: 0.82 mg/dL (ref 0.40–1.50)
GFR: 95.81 mL/min (ref >60.00)
Glucose, Bld: 73 mg/dL (ref 70–99)
Potassium: 4.4 mEq/L (ref 3.5–5.1)
Sodium: 138 mEq/L (ref 135–145)
Total Bilirubin: 0.6 mg/dL (ref 0.2–1.2)
Total Protein: 7.1 g/dL (ref 6.0–8.3)

## 2019-11-17 NOTE — Patient Instructions (Signed)
At [facility], we are fully committed to providing effective and safe vaccines to eliminate COVID-19 in our communities.  Get Your Vaccine - All Phases Now Eligible Everyone 60 and older who wants a COVID-19 vaccine can get one through [facility]. Please note that only those age 60 and older can register for an appointment at our [facility] clinic. This site distributes the [employer] vaccine, which is authorized for those age 60 and older. All other sites are open to those age 60 and older.     Schedule Your Vaccine Appointment at [email or web address]  [place] is opening appointments weekly, based on the number of vaccine doses provided by the state. To receive email notices about when we will open additional appointments each week, please sign up for our weekly email with information about COVID-19 vaccine appointment availability.   [place] has created mass vaccination clinics in [place], [place] and [place] counties in partnership with [place], [place] and [place] health departments. Vaccination at these clinics is by appointment only. Walk-ins will NOT be accepted.  [place] is opening appointments weekly, based on the number of vaccine doses provided by the state. To receive email notices about when we will open additional appointments each week, please sign up for our weekly email with information about COVID-19 vaccine appointment availability   There are Currently Three Other Vaccination Opportunities in [place]:  1. Burley Krakow) Eligible members of the public can make appointments online for a vaccine at the federally supported [place] in [place] at [employer]. Appointments can be made online by visiting [email or web address].  The FEMA site will operate  seven days a week with the capacity to provide up to 3,000 vaccinations per day, with options for drive-thru service in the parking lot and service at an indoor clinic. The federal government will provide the center's vaccine supply, which will be in addition to supply [place] receives through [employer] from the Centers for Disease Control.  [place] health departments in [facility] service area are also scheduling vaccine appointments for those eligible. Learn more about each county's vaccination efforts in the website links below:  - [place]  3. [facility] throughout [place] are offering COVID-19 vaccines to eligible individuals. Click here to schedule appointments and learn more.  Find all vaccine locations throughout the state of [place] at [email or web address]  Vaccine Safety and Effectiveness All three vaccines authorized for emergency use by the U.S. Food and Drug Administration are recommended by the U.S. Centers for Disease Control and Prevention as safe and effective treatments for the prevention of COVID-19. These include the [employer], Science [employer] and [employer].  Clinical trials involving tens of thousands of people for each vaccine have shown that the vaccines are extremely effective in preventing COVID-19 with no serious safety concerns. Side effects reported include a sore arm at the injection site, fatigue, headache, chills and fever. While side effects are sometimes higher than for a typical flu vaccine, they are lower in many ways than side effects from the leading vaccine to prevent shingles.  Side effects are signs that a vaccine is working and are related to your immune system being stimulated to produce antibodies against infection. Side effects from vaccination are far less significant than health impacts from COVID-19.  Staying  Informed Pharmacists, infectious disease  doctors, critical care nurses and other experts at [facility] continue to speak publicly through media interviews and direct communication with our patients and communities about the safety, effectiveness and importance of vaccines to eliminate COVID-19.  In addition, reliable information on vaccine safety, effectiveness, side effects and more is available on the following websites:  [place] Department of Health and Human Services COVID-19 Vaccine Information Website.  U.S. Centers for Disease Control and Prevention [id number] Human resources officer.  Staying Safe We agree with the CDC on what we can do to help our communities get back to normal: Getting "back to normal" is going to take all of our tools. If we use all the tools we have, we stand the best chance of getting our families, communities, schools and workplaces "back to normal" sooner:  Get vaccinated as soon as vaccines become available within the phase of the state's vaccination rollout plan for which you meet the eligibility criteria.  Wear a mask.  Stay 6 feet from others and avoid crowds.  Wash hands often.  Masking, social distancing and hand hygiene should be followed by everyone, including those who have been vaccinated.  For our most current information, please visit [email or web address].

## 2019-11-17 NOTE — Progress Notes (Signed)
Office Note 11/17/2019  CC:  Chief Complaint  Patient presents with  . Pre-op clearance    left knee surgery on 12/09/19    HPI:  Billy Bates is a 59 y.o. White male who is here for pre-op medical clearance for L TKA scheduled for 12/09/19 (Dr. Rhona Raider). I last saw him Feb of this year for pre-op clearance for RIGHT TKA.  This surgery went well. He feels well, no acute complaints.  Again, exercise capacity: no limitation except for knee pain.   No hx of severe systemic illness, CAD, CHF, cardiac valvular disorder, arrhythmia, or pulm htn. No hx of problems with any anesthesia or signif/abnl bleeding problems with surgeries in the past. OSA sx's: no snoring, no apneic events, no excessive daytime sleepiness.  No home bp monitoring. Tolerating cholest med well.  He takes tylenol prn pain, rx meds listed below-->pt compliant.  No opioids at this time.  Uses lorazepam on prn basis (avg 1-2 tabs q2 nights) to help sleep.     Past Medical History:  Diagnosis Date  . Adenomatous colon polyp 12/2017   sessile serrated polyp at appendiceal orifice, not completely removed at endoscopy.  Pt had extended lap appendectomy to make sure entire polyp removed..  . Elevated PSA    Repeat 1 mo later was back down.  No bx.  No urology referral.  . Family history of prostate cancer    Father  . Fibromyalgia    ???  . GERD (gastroesophageal reflux disease)   . History of colitis    Lymphocytic colitis; episodic flares (most recent approx 2015)  . Hyperlipidemia    borderline--controlled with TLC.  2018 Fr[place]ham 10 yr CV risk = 8.1%: atorva started.  . Hypertension   . IFG (impaired fasting glucose)    A1c consistently <6%  . Insomnia   . Kidney laceration    Hx of, 1980  . Organic impotence   . Osteoarthritis, multiple sites    neck/knees.  L knee tricompartmental bone on bone.  R knee mild to moderate osteoarthritis (Dr. French Ana)  . Splenic laceration    Hx of, 1980     Past Surgical History:  Procedure Laterality Date  . CERVICAL FUSION  approx 1999   C5-6  . COLONOSCOPY  12/2006; 01/2009; 12/2017   [facility] in [place], [place]--Dr. Ann Lions:  2008 (rectal bleeding)-Nonadenomatous polyp: rpt 10 yrs rec'd.  2010: RLQ pain and diarrhea--lymphocytic colitis.  Ileal polyp-benign, diverticulosis.  Recall 5 yrs per GI MD notes.  01/19/18 1 sess serr polyp at appendiceal orifice--incompletely removed.  GI referred him to gen surg for extended appendectomy.  Recall 12/2020.  Billy Bates KNEE SURGERY     Ex baseball player.  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 LAPAROSCOPIC APPENDECTOMY N/A 03/23/2018   Procedure: LAPAROSCOPIC APPENDECTOMY;  Surgeon: Georganna Skeans, MD;  Location: [facility];  Service: General;  Laterality: N/A;  . TONSILLECTOMY AND ADENOIDECTOMY  1968    Family History  Problem Relation Age of Onset  . Breast cancer Mother   . Heart disease Mother   . Arthritis Father   . Prostate cancer Father   . Stroke Father   . Hypertension Father   . Diabetes Neg Hx   . Colon cancer Neg Hx     Social History   Socioeconomic History  . Marital status: Married    Spouse name: Not on file  . Number of children: Not on file  . Years of education: Not on file  .  Highest education level: Not on file  Occupational History  . Not on file  Tobacco Use  . Smoking status: Never Smoker  . Smokeless tobacco: Former Systems developer    Types: Snuff  Substance and Sexual Activity  . Alcohol use: Yes    Alcohol/week: 12.0 standard drinks    Types: 12 Cans of beer per week  . Drug use: No  . Sexual activity: Not on file  Other Topics Concern  . Not on file  Social History Narrative   Married, 2 daughters.   Educ: BS   Occupation: Freight forwarder for [employer]      No [place] or drugs.  Occ alcohol.   Social Determinants of Health   Financial Resource Strain:   . Difficulty of Paying Living Expenses:   Food Insecurity:   . Worried About Charity fundraiser  in the Last Year:   . Arboriculturist in the Last Year:   Transportation Needs:   . Film/video editor (Medical):   Billy Bates Lack of Transportation (Non-Medical):   Physical Activity:   . Days of Exercise per Week:   . Minutes of Exercise per Session:   Stress:   . Feeling of Stress :   Social Connections:   . Frequency of Communication with Friends and Family:   . Frequency of Social Gatherings with Friends and Family:   . Attends Religious Services:   . Active Member of Clubs or Organizations:   . Attends Archivist Meetings:   Billy Bates Marital Status:   Intimate Partner Violence:   . Fear of Current or Ex-Partner:   . Emotionally Abused:   Billy Bates Physically Abused:   . Sexually Abused:     Outpatient Medications Prior to Visit  Medication Sig Dispense Refill  . acetaminophen (TYLENOL) 500 MG tablet Take 1,500-2,000 mg by mouth daily as needed for moderate pain or headache.    Billy Bates atorvastatin (LIPITOR) 40 MG tablet Take 1 tablet (40 mg total) by mouth daily. 90 tablet 0  . CVS ASPIRIN ADULT LOW DOSE 81 MG chewable tablet Chew 81 mg by mouth 2 (two) times daily.    Billy Bates lisinopril (ZESTRIL) 10 MG tablet Take 1 tablet (10 mg total) by mouth daily. 90 tablet 1  . LORazepam (ATIVAN) 0.5 MG tablet TAKE 1 TABLET (0.5 MG TOTAL) BY MOUTH DAILY AS NEEDED. 30 tablet 1  . Multiple Vitamin (MULTIVITAMIN) tablet Take 1 tablet by mouth daily.    . calcium carbonate (TUMS - DOSED IN MG ELEMENTAL CALCIUM) 500 MG chewable tablet Chew 2-3 tablets by mouth daily as needed for indigestion or heartburn.     No facility-administered medications prior to visit.    No Known Allergies  ROS Review of Systems  Constitutional: Negative for appetite change, chills, fatigue and fever.  HENT: Negative for congestion, dental problem, ear pain and sore throat.   Eyes: Negative for discharge, redness and visual disturbance.  Respiratory: Negative for cough, chest tightness, shortness of breath and wheezing.    Cardiovascular: Negative for chest pain, palpitations and leg swelling.  Gastrointestinal: Negative for abdominal pain, blood in stool, diarrhea, nausea and vomiting.  Genitourinary: Negative for difficulty urinating, dysuria, flank pain, frequency, hematuria and urgency.  Musculoskeletal: Positive for arthralgias (chronic L knee arthritis pain). Negative for back pain, joint swelling, myalgias and neck stiffness.  Skin: Negative for pallor and rash.  Neurological: Negative for dizziness, speech difficulty, weakness and headaches.  Hematological: Negative for adenopathy. Does not bruise/bleed easily.  Psychiatric/Behavioral: Negative  for confusion and sleep disturbance. The patient is not nervous/anxious.     PE; Blood pressure 126/89, pulse 83, temperature 97.6 F (36.4 C), temperature source Temporal, resp. rate 16, height 5\' 10"  (1.778 m), weight 198 lb (89.8 kg), SpO2 98 %. Body mass index is 28.41 kg/m.  Gen: Alert, well appearing.  Patient is oriented to person, place, time, and situation. AFFECT: pleasant, lucid thought and speech. ENT: Ears: EACs clear, normal epithelium.  TMs with good light reflex and landmarks bilaterally.  Eyes: no injection, icteris, swelling, or exudate.  EOMI, PERRLA. Nose: no drainage or turbinate edema/swelling.  No injection or focal lesion.  Mouth: lips without lesion/swelling.  Oral mucosa pink and moist.  Dentition intact and without obvious caries or gingival swelling.  Oropharynx without erythema, exudate, or swelling.  Neck: supple/nontender.  No LAD, mass, or TM.  Carotid pulses 2+ bilaterally, without bruits. CV: RRR, no m/r/g.   LUNGS: CTA bilat, nonlabored resps, good aeration in all lung fields. ABD: soft, NT, ND, BS normal.  No hepatospenomegaly or mass.  No bruits. EXT: no clubbing, cyanosis, or edema.  Musculoskeletal: no joint swelling, erythema, warmth, or tenderness.  ROM of all joints intact. Skin - no sores or suspicious lesions or  rashes or color changes  Pertinent labs:  Lab Results  Component Value Date   TSH 1.54 02/23/2019   Lab Results  Component Value Date   WBC 7.8 08/30/2019   HGB 16.3 08/30/2019   HCT 47.5 08/30/2019   MCV 88.8 08/30/2019   PLT 254 08/30/2019   Lab Results  Component Value Date   CREATININE 0.84 08/30/2019   BUN 10 08/30/2019   NA 141 08/30/2019   K 4.0 08/30/2019   CL 103 08/30/2019   CO2 27 08/30/2019   Lab Results  Component Value Date   ALT 41 08/30/2019   AST 28 08/30/2019   ALKPHOS 63 02/23/2019   BILITOT 0.8 08/30/2019   Lab Results  Component Value Date   CHOL 153 02/23/2019   Lab Results  Component Value Date   HDL 53.40 02/23/2019   Lab Results  Component Value Date   LDLCALC 70 02/23/2019   Lab Results  Component Value Date   TRIG 146.0 02/23/2019   Lab Results  Component Value Date   CHOLHDL 3 02/23/2019   Lab Results  Component Value Date   PSA 2.72 02/23/2019   PSA 2.27 05/09/2017   PSA 2.69 03/25/2016   Lab Results  Component Value Date   HGBA1C 5.3 08/30/2019    ASSESSMENT AND PLAN:   Preoperative clearance for TKA later this month. No indication for any pre-surg cardiopulmonary testing. Medical problems optimally controlled. He is cleared from cardiac and medical standpoint.  Labs wnl 2 mo ago.  Since he had L knee surgery since last labs I'll check CBC and BMET + PT/INR at this time.  OK to see in 1 yr for CPE. He'll call and make lab appt in near future when his employer requests biometrics.  Will add screening PSA to these labs (ordered).  Insomnia: lorazepam somewhat helpful.  OK to continue this med on prn basis. No RF needed today.  An After Visit Summary was printed and given to the patient.  FOLLOW UP:  Return in about 1 year (around 11/16/2020) for annual CPE (fasting).  Signed:  Crissie Sickles, MD           11/17/2019

## 2019-11-18 LAB — CBC WITH DIFFERENTIAL/PLATELET
Basophils Absolute: 0.1 10*3/uL (ref 0.0–0.1)
Basophils Relative: 0.8 % (ref 0.0–3.0)
Eosinophils Absolute: 0.1 10*3/uL (ref 0.0–0.7)
Eosinophils Relative: 1.3 % (ref 0.0–5.0)
HCT: 45.6 % (ref 39.0–52.0)
Hemoglobin: 15.1 g/dL (ref 13.0–17.0)
Lymphocytes Relative: 35.1 % (ref 12.0–46.0)
Lymphs Abs: 2.6 10*3/uL (ref 0.7–4.0)
MCHC: 33 g/dL (ref 30.0–36.0)
MCV: 91.3 fl (ref 78.0–100.0)
Monocytes Absolute: 0.7 10*3/uL (ref 0.1–1.0)
Monocytes Relative: 9.6 % (ref 3.0–12.0)
Neutro Abs: 3.9 10*3/uL (ref 1.4–7.7)
Neutrophils Relative %: 53.2 % (ref 43.0–77.0)
Platelets: 282 10*3/uL (ref 150.0–400.0)
RBC: 5 Mil/uL (ref 4.22–5.81)
RDW: 14.7 % (ref 11.5–15.5)
WBC: 7.3 10*3/uL (ref 4.0–10.5)

## 2019-11-22 DIAGNOSIS — Z96651 Presence of right artificial knee joint: Secondary | ICD-10-CM | POA: Diagnosis not present

## 2019-11-22 DIAGNOSIS — M1712 Unilateral primary osteoarthritis, left knee: Secondary | ICD-10-CM | POA: Diagnosis not present

## 2019-12-09 DIAGNOSIS — M1712 Unilateral primary osteoarthritis, left knee: Secondary | ICD-10-CM | POA: Diagnosis not present

## 2019-12-31 ENCOUNTER — Other Ambulatory Visit: Payer: Self-pay | Admitting: Family Medicine

## 2020-01-03 DIAGNOSIS — Z96653 Presence of artificial knee joint, bilateral: Secondary | ICD-10-CM | POA: Diagnosis not present

## 2020-01-19 ENCOUNTER — Other Ambulatory Visit: Payer: Self-pay | Admitting: Family Medicine

## 2020-01-20 NOTE — Telephone Encounter (Signed)
LAST APPOINTMENT DATE: 11/17/2019   NEXT APPOINTMENT DATE: Visit date not found   Rx Ativan LAST REFILL:11/08/2019  QTY: 30 1 ref

## 2020-03-01 DIAGNOSIS — Z96653 Presence of artificial knee joint, bilateral: Secondary | ICD-10-CM | POA: Diagnosis not present

## 2020-03-01 DIAGNOSIS — R200 Anesthesia of skin: Secondary | ICD-10-CM | POA: Diagnosis not present

## 2020-03-10 ENCOUNTER — Other Ambulatory Visit: Payer: Self-pay | Admitting: Family Medicine

## 2020-03-24 DIAGNOSIS — R200 Anesthesia of skin: Secondary | ICD-10-CM | POA: Diagnosis not present

## 2020-04-12 DIAGNOSIS — G5601 Carpal tunnel syndrome, right upper limb: Secondary | ICD-10-CM | POA: Diagnosis not present

## 2020-04-12 DIAGNOSIS — G5602 Carpal tunnel syndrome, left upper limb: Secondary | ICD-10-CM | POA: Diagnosis not present

## 2020-04-12 DIAGNOSIS — Z96652 Presence of left artificial knee joint: Secondary | ICD-10-CM | POA: Diagnosis not present

## 2020-04-17 ENCOUNTER — Telehealth: Payer: Self-pay

## 2020-04-17 NOTE — Telephone Encounter (Signed)
Pre-operative clearance form placed on your desk for completion

## 2020-04-17 NOTE — Telephone Encounter (Signed)
From my standpoint he does not need any further labs.

## 2020-04-17 NOTE — Telephone Encounter (Signed)
Patient is having surgery on 05/01/20. He had labs done on 11/17/19 for preoperative clearance. Does he still need to schedule an appointment for updated labs, etc?

## 2020-04-18 ENCOUNTER — Telehealth: Payer: Self-pay

## 2020-04-18 NOTE — Telephone Encounter (Signed)
Patient will be dropping form for work to qualify for discount on his insurance.

## 2020-04-18 NOTE — Telephone Encounter (Signed)
Signed and put in box to go up front. Signed:  Crissie Sickles, MD           04/18/2020

## 2020-04-18 NOTE — Telephone Encounter (Signed)
Faxed form with OV notes from April 2021 visit.

## 2020-04-19 ENCOUNTER — Other Ambulatory Visit: Payer: Self-pay

## 2020-04-19 ENCOUNTER — Telehealth: Payer: Self-pay

## 2020-04-19 DIAGNOSIS — E7800 Pure hypercholesterolemia, unspecified: Secondary | ICD-10-CM

## 2020-04-19 DIAGNOSIS — I10 Essential (primary) hypertension: Secondary | ICD-10-CM

## 2020-04-19 DIAGNOSIS — R7301 Impaired fasting glucose: Secondary | ICD-10-CM

## 2020-04-19 NOTE — Telephone Encounter (Signed)
Message below added to 9/20 phone message about surgical clearance.

## 2020-04-19 NOTE — Telephone Encounter (Signed)
Form not received yet

## 2020-04-19 NOTE — Telephone Encounter (Signed)
OK pls have pt come in for fasting lab appt for BMET, CBC w/diff, PT/INR, and HbA1c.  Dx are IFG, HTN, and Hypercholesterolemia. Make sure patient is aware that it is his orthopedist who is insisting he get these labs at this time.-thx

## 2020-04-19 NOTE — Telephone Encounter (Signed)
LM for pt to return call. Future labs entered as directed.

## 2020-04-19 NOTE — Telephone Encounter (Signed)
Please see message below regarding requested labs.  Note [facility] is calling to request up-to-date labs Chem 7, CBC w/diff, PTINR,A1C for patient's upcoming surgery

## 2020-04-19 NOTE — Telephone Encounter (Signed)
Patient returned call and advised lab visit needed for up to date labs. Lab appt scheduled.

## 2020-04-19 NOTE — Telephone Encounter (Signed)
[facility] is calling to request up-to-date labs Chem 7, CBC w/diff, PTINR,A1C for patient's upcoming surgery.

## 2020-04-24 DIAGNOSIS — G5601 Carpal tunnel syndrome, right upper limb: Secondary | ICD-10-CM | POA: Diagnosis not present

## 2020-04-25 ENCOUNTER — Ambulatory Visit (INDEPENDENT_AMBULATORY_CARE_PROVIDER_SITE_OTHER): Payer: BC Managed Care – PPO

## 2020-04-25 ENCOUNTER — Other Ambulatory Visit: Payer: Self-pay

## 2020-04-25 DIAGNOSIS — R7301 Impaired fasting glucose: Secondary | ICD-10-CM

## 2020-04-25 DIAGNOSIS — E7800 Pure hypercholesterolemia, unspecified: Secondary | ICD-10-CM

## 2020-04-25 DIAGNOSIS — I10 Essential (primary) hypertension: Secondary | ICD-10-CM

## 2020-04-25 LAB — CBC WITH DIFFERENTIAL/PLATELET
Basophils Absolute: 0 10*3/uL (ref 0.0–0.1)
Basophils Relative: 0.1 % (ref 0.0–3.0)
Eosinophils Absolute: 0 10*3/uL (ref 0.0–0.7)
Eosinophils Relative: 0 % (ref 0.0–5.0)
HCT: 46.1 % (ref 39.0–52.0)
Hemoglobin: 15.2 g/dL (ref 13.0–17.0)
Lymphocytes Relative: 12.2 % (ref 12.0–46.0)
Lymphs Abs: 1.4 10*3/uL (ref 0.7–4.0)
MCHC: 33.1 g/dL (ref 30.0–36.0)
MCV: 88.7 fl (ref 78.0–100.0)
Monocytes Absolute: 0.5 10*3/uL (ref 0.1–1.0)
Monocytes Relative: 4 % (ref 3.0–12.0)
Neutro Abs: 9.7 10*3/uL — ABNORMAL HIGH (ref 1.4–7.7)
Neutrophils Relative %: 83.7 % — ABNORMAL HIGH (ref 43.0–77.0)
Platelets: 254 10*3/uL (ref 150.0–400.0)
RBC: 5.19 Mil/uL (ref 4.22–5.81)
RDW: 14.7 % (ref 11.5–15.5)
WBC: 11.6 10*3/uL — ABNORMAL HIGH (ref 4.0–10.5)

## 2020-04-25 LAB — HEMOGLOBIN A1C: Hgb A1c MFr Bld: 6 % (ref 4.6–6.5)

## 2020-04-25 LAB — BASIC METABOLIC PANEL
BUN: 16 mg/dL (ref 6–23)
CO2: 28 mEq/L (ref 19–32)
Calcium: 9.9 mg/dL (ref 8.4–10.5)
Chloride: 103 mEq/L (ref 96–112)
Creatinine, Ser: 0.81 mg/dL (ref 0.40–1.50)
GFR: 97.04 mL/min (ref >60.00)
Glucose, Bld: 106 mg/dL — ABNORMAL HIGH (ref 70–99)
Potassium: 4.3 mEq/L (ref 3.5–5.1)
Sodium: 139 mEq/L (ref 135–145)

## 2020-04-25 LAB — PROTIME-INR
INR: 1 ratio (ref 0.8–1.0)
Prothrombin Time: 11.5 s (ref 9.6–13.1)

## 2020-04-25 NOTE — Telephone Encounter (Signed)
Form given to Lorriane Shire, will notify patient of completion along with pre-op lab form.

## 2020-04-26 NOTE — Telephone Encounter (Signed)
Spoke with patient and advised pre-op forms and requested information faxed to number provided on form. Copy made of pre-op form as well for chart. Advised we would need waist circumference and signature for full completion. He will come by Friday to complete this. Will place up front

## 2020-04-27 ENCOUNTER — Encounter: Payer: Self-pay | Admitting: Family Medicine

## 2020-05-10 DIAGNOSIS — G5601 Carpal tunnel syndrome, right upper limb: Secondary | ICD-10-CM | POA: Diagnosis not present

## 2020-05-10 DIAGNOSIS — Z96652 Presence of left artificial knee joint: Secondary | ICD-10-CM | POA: Diagnosis not present

## 2020-05-11 DIAGNOSIS — M65331 Trigger finger, right middle finger: Secondary | ICD-10-CM | POA: Diagnosis not present

## 2020-05-11 DIAGNOSIS — G5601 Carpal tunnel syndrome, right upper limb: Secondary | ICD-10-CM | POA: Diagnosis not present

## 2020-05-17 ENCOUNTER — Telehealth: Payer: Self-pay

## 2020-05-17 NOTE — Telephone Encounter (Signed)
Spoke with pt regarding labs and instructions.   

## 2020-05-25 ENCOUNTER — Other Ambulatory Visit: Payer: Self-pay | Admitting: Orthopaedic Surgery

## 2020-06-09 ENCOUNTER — Ambulatory Visit (INDEPENDENT_AMBULATORY_CARE_PROVIDER_SITE_OTHER): Payer: BC Managed Care – PPO

## 2020-06-09 ENCOUNTER — Other Ambulatory Visit: Payer: Self-pay

## 2020-06-09 ENCOUNTER — Encounter: Payer: Self-pay | Admitting: Family Medicine

## 2020-06-09 DIAGNOSIS — Z23 Encounter for immunization: Secondary | ICD-10-CM | POA: Diagnosis not present

## 2020-06-12 NOTE — Progress Notes (Signed)
DUE TO COVID-19 ONLY ONE VISITOR IS ALLOWED TO COME WITH YOU AND STAY IN THE WAITING ROOM ONLY DURING PRE OP AND PROCEDURE DAY OF SURGERY. THE 1 VISITOR  MAY VISIT WITH YOU AFTER SURGERY IN YOUR PRIVATE ROOM DURING VISITING HOURS ONLY!  YOU NEED TO HAVE A COVID 19 TEST ON_11/26/2021 ______ @_______ , THIS TEST MUST BE DONE BEFORE SURGERY,  COVID TESTING SITE [address] [place] [id number], IT IS ON THE RIGHT GOING OUT [address] APPROXIMATELY  2 MINUTES PAST [employer] ON THE RIGHT. ONCE YOUR COVID TEST IS COMPLETED,  PLEASE BEGIN THE QUARANTINE INSTRUCTIONS AS OUTLINED IN YOUR HANDOUT.                Billy Bates  06/12/2020   Your procedure is scheduled on: 06/27/2020    Report to [facility] Main  Entrance   Report to admitting at     [place] AM     Call this number if you have problems the morning of surgery [phone]    REMEMBER: NO  SOLID FOOD CANDY OR GUM AFTER MIDNIGHT. CLEAR LIQUIDS UNTIL         . NOTHING BY MOUTH EXCEPT CLEAR LIQUIDS UNTIL    . PLEASE FINISH ENSURE [id number] PER SURGEON ORDER  WHICH NEEDS TO BE COMPLETED AT 0645am      .      CLEAR LIQUID DIET   Foods Allowed                                                                    Coffee and tea, regular and decaf                            Fruit ices (not with fruit pulp)                                      Iced Popsicles                                    Carbonated beverages, regular and diet                                    Cranberry, grape and apple juices Sports drinks like Gatorade Lightly seasoned clear broth or consume(fat free) Sugar, honey syrup ___________________________________________________________________      BRUSH YOUR TEETH MORNING OF SURGERY AND RINSE YOUR MOUTH OUT, NO CHEWING GUM CANDY OR MINTS.     Take these medicines the morning of surgery with A SIP OF WATER:  None   DO NOT TAKE ANY DIABETIC MEDICATIONS DAY OF YOUR SURGERY                                You may not have any metal on your body including hair pins and              piercings  Do  not wear jewelry, make-up, lotions, powders or perfumes, deodorant             Do not wear nail polish on your fingernails.  Do not shave  48 hours prior to surgery.              Men may shave face and neck.   Do not bring valuables to the hospital. [place].  Contacts, dentures or bridgework may not be worn into surgery.  Leave suitcase in the car. After surgery it may be brought to your room.     Patients discharged the day of surgery will not be allowed to drive home. IF YOU ARE HAVING SURGERY AND GOING HOME THE SAME DAY, YOU MUST HAVE AN ADULT TO DRIVE YOU HOME AND BE WITH YOU FOR 24 HOURS. YOU MAY GO HOME BY TAXI OR UBER OR ORTHERWISE, BUT AN ADULT MUST ACCOMPANY YOU HOME AND STAY WITH YOU FOR 24 HOURS.  Name and phone number of your driver:  Special Instructions: N/A              Please read over the following fact sheets you were given: _____________________________________________________________________  [facility] - Preparing for Surgery Before surgery, you can play an important role.  Because skin is not sterile, your skin needs to be as free of germs as possible.  You can reduce the number of germs on your skin by washing with CHG (chlorahexidine gluconate) soap before surgery.  CHG is an antiseptic cleaner which kills germs and bonds with the skin to continue killing germs even after washing. Please DO NOT use if you have an allergy to CHG or antibacterial soaps.  If your skin becomes reddened/irritated stop using the CHG and inform your nurse when you arrive at Short Stay. Do not shave (including legs and underarms) for at least 48 hours prior to the first CHG shower.  You may shave your face/neck. Please follow these instructions carefully:  1.  Shower with CHG Soap the night before surgery and the  morning of  Surgery.  2.  If you choose to wash your hair, wash your hair first as usual with your  normal  shampoo.  3.  After you shampoo, rinse your hair and body thoroughly to remove the  shampoo.                           4.  Use CHG as you would any other liquid soap.  You can apply chg directly  to the skin and wash                       Gently with a scrungie or clean washcloth.  5.  Apply the CHG Soap to your body ONLY FROM THE NECK DOWN.   Do not use on face/ open                           Wound or open sores. Avoid contact with eyes, ears mouth and genitals (private parts).                       Wash face,  Genitals (private parts) with your normal soap.             6.  Wash thoroughly,  paying special attention to the area where your surgery  will be performed.  7.  Thoroughly rinse your body with warm water from the neck down.  8.  DO NOT shower/wash with your normal soap after using and rinsing off  the CHG Soap.                9.  Pat yourself dry with a clean towel.            10.  Wear clean pajamas.            11.  Place clean sheets on your bed the night of your first shower and do not  sleep with pets. Day of Surgery : Do not apply any lotions/deodorants the morning of surgery.  Please wear clean clothes to the hospital/surgery center.  FAILURE TO FOLLOW THESE INSTRUCTIONS MAY RESULT IN THE CANCELLATION OF YOUR SURGERY PATIENT SIGNATURE_________________________________  NURSE SIGNATURE__________________________________  ________________________________________________________________________

## 2020-06-14 ENCOUNTER — Ambulatory Visit (HOSPITAL_COMMUNITY)
Admission: RE | Admit: 2020-06-14 | Discharge: 2020-06-14 | Disposition: A | Discharge status: Home / Self Care | Payer: BC Managed Care – PPO | Source: Ambulatory Visit | Attending: Orthopaedic Surgery | Admitting: Orthopaedic Surgery

## 2020-06-14 ENCOUNTER — Other Ambulatory Visit: Payer: Self-pay

## 2020-06-14 ENCOUNTER — Encounter (HOSPITAL_COMMUNITY)
Admission: RE | Admit: 2020-06-14 | Discharge: 2020-06-14 | Disposition: A | Discharge status: Home / Self Care | Payer: BC Managed Care – PPO | Source: Ambulatory Visit | Attending: Orthopaedic Surgery | Admitting: Orthopaedic Surgery

## 2020-06-14 ENCOUNTER — Encounter (HOSPITAL_COMMUNITY): Payer: Self-pay

## 2020-06-14 DIAGNOSIS — Z01818 Encounter for other preprocedural examination: Secondary | ICD-10-CM | POA: Diagnosis not present

## 2020-06-14 LAB — URINALYSIS, ROUTINE W REFLEX MICROSCOPIC
Bilirubin Urine: NEGATIVE
Glucose, UA: NEGATIVE mg/dL
Hgb urine dipstick: NEGATIVE
Ketones, ur: NEGATIVE mg/dL
Leukocytes,Ua: NEGATIVE
Nitrite: NEGATIVE
Protein, ur: NEGATIVE mg/dL
Specific Gravity, Urine: 1.003 — ABNORMAL LOW (ref 1.005–1.030)
pH: 7 (ref 5.0–8.0)

## 2020-06-14 LAB — CBC WITH DIFFERENTIAL/PLATELET
Abs Immature Granulocytes: 0.03 10*3/uL (ref 0.00–0.07)
Basophils Absolute: 0 10*3/uL (ref 0.0–0.1)
Basophils Relative: 0 %
Eosinophils Absolute: 0.1 10*3/uL (ref 0.0–0.5)
Eosinophils Relative: 2 %
HCT: 48.1 % (ref 39.0–52.0)
Hemoglobin: 15.9 g/dL (ref 13.0–17.0)
Immature Granulocytes: 0 %
Lymphocytes Relative: 35 %
Lymphs Abs: 2.5 10*3/uL (ref 0.7–4.0)
MCH: 29.6 pg (ref 26.0–34.0)
MCHC: 33.1 g/dL (ref 30.0–36.0)
MCV: 89.6 fL (ref 80.0–100.0)
Monocytes Absolute: 0.7 10*3/uL (ref 0.1–1.0)
Monocytes Relative: 9 %
Neutro Abs: 3.8 10*3/uL (ref 1.7–7.7)
Neutrophils Relative %: 54 %
Platelets: 248 10*3/uL (ref 150–400)
RBC: 5.37 MIL/uL (ref 4.22–5.81)
RDW: 13.2 % (ref 11.5–15.5)
WBC: 7.1 10*3/uL (ref 4.0–10.5)
nRBC: 0 % (ref 0.0–0.2)

## 2020-06-14 LAB — BASIC METABOLIC PANEL
Anion gap: 4 — ABNORMAL LOW (ref 5–15)
BUN: 15 mg/dL (ref 6–20)
CO2: 32 mmol/L (ref 22–32)
Calcium: 9.7 mg/dL (ref 8.9–10.3)
Chloride: 102 mmol/L (ref 98–111)
Creatinine, Ser: 0.81 mg/dL (ref 0.61–1.24)
GFR, Estimated: >60 mL/min (ref >60)
Glucose, Bld: 101 mg/dL — ABNORMAL HIGH (ref 70–99)
Potassium: 4.5 mmol/L (ref 3.5–5.1)
Sodium: 138 mmol/L (ref 135–145)

## 2020-06-14 LAB — SURGICAL PCR SCREEN
MRSA, PCR: NEGATIVE
Staphylococcus aureus: NEGATIVE

## 2020-06-14 LAB — APTT: aPTT: 31 seconds (ref 24–36)

## 2020-06-14 LAB — PROTIME-INR
INR: 0.9 (ref 0.8–1.2)
Prothrombin Time: 12.2 seconds (ref 11.4–15.2)

## 2020-06-15 NOTE — Progress Notes (Signed)
Final ekg and cxr done 06/14/2020 - epic

## 2020-06-26 ENCOUNTER — Other Ambulatory Visit (HOSPITAL_COMMUNITY)
Admission: RE | Admit: 2020-06-26 | Discharge: 2020-06-26 | Disposition: A | Discharge status: Home / Self Care | Payer: BC Managed Care – PPO | Source: Ambulatory Visit | Attending: Orthopaedic Surgery | Admitting: Orthopaedic Surgery

## 2020-06-26 DIAGNOSIS — Z20822 Contact with and (suspected) exposure to covid-19: Secondary | ICD-10-CM | POA: Insufficient documentation

## 2020-06-26 DIAGNOSIS — Z01812 Encounter for preprocedural laboratory examination: Secondary | ICD-10-CM | POA: Insufficient documentation

## 2020-06-26 LAB — SARS CORONAVIRUS 2 (TAT 6-24 HRS): SARS Coronavirus 2: NEGATIVE

## 2020-06-26 MED ORDER — BUPIVACAINE LIPOSOME 1.3 % IJ SUSP
20.0000 mL | Freq: Once | INTRAMUSCULAR | Status: DC
  Filled 2020-06-26: qty 20

## 2020-06-26 MED ORDER — TRANEXAMIC ACID 1000 MG/10ML IV SOLN
2000.0000 mg | INTRAVENOUS | Status: DC
  Filled 2020-06-26: qty 20

## 2020-06-26 NOTE — H&P (Signed)
TOTAL KNEE REVISION ADMISSION H&P  Patient is being admitted for left revision total knee arthroplasty.  Subjective:  Chief Complaint:left knee pain.  HPI: Billy Bates, 59 y.o. male, has a history of pain and functional disability in the left knee(s) due to failed previous arthroplasty and patient has failed non-surgical conservative treatments for greater than 12 weeks to include NSAID's and/or analgesics, flexibility and strengthening excercises, supervised PT with diminished ADL's post treatment, use of assistive devices, weight reduction as appropriate and activity modification. The indications for the revision of the total knee arthroplasty are fracture or mechanical failure of one or components. Onset of symptoms was gradual starting 3 months  years ago with gradually worsening course since that time.  Prior procedures on the left knee(s) include arthroplasty.  Patient currently rates pain in the left knee(s) at 10 out of 10 with activity. There is worsening of pain with activity and weight bearing, pain that interferes with activities of daily living, joint swelling and instability with walking.  Patient has evidence of a TKR prothesis with lifting off of the poly insert by imaging studies. This condition presents safety issues increasing the risk of falls. There is no current active infection.  Patient Active Problem List   Diagnosis Date Noted  . Overweight (BMI 25.0-29.9) 02/23/2019  . Prostate cancer screening 02/09/2016   Past Medical History:  Diagnosis Date  . Adenomatous colon polyp 12/2017   sessile serrated polyp at appendiceal orifice, not completely removed at endoscopy.  Pt had extended lap appendectomy to make sure entire polyp removed..  . Elevated PSA    Repeat 1 mo later was back down.  No bx.  No urology referral.  . Family history of prostate cancer    Father  . GERD (gastroesophageal reflux disease)   . History of colitis    Lymphocytic colitis; episodic  flares (most recent approx 2015)  . Hyperlipidemia    borderline--controlled with TLC.  2018 Fr[facility]ham 10 yr CV risk = 8.1%: atorva started.  . Hypertension   . Insomnia   . Kidney laceration    Hx of, 1980  . Organic impotence   . Osteoarthritis, multiple sites    neck/knees.  L knee tricompartmental bone on bone.  R knee mild to moderate osteoarthritis (Dr. French Ana)  . Prediabetes    A1c consistently <6% until 03/2020->A1c 6.0%  . Splenic laceration    Hx of, 1980    Past Surgical History:  Procedure Laterality Date  . CERVICAL FUSION  approx 1999   C5-6  . COLONOSCOPY  12/2006; 01/2009; 12/2017   [facility] in [place], [place]--Dr. Ann Lions:  2008 (rectal bleeding)-Nonadenomatous polyp: rpt 10 yrs rec'd.  2010: RLQ pain and diarrhea--lymphocytic colitis.  Ileal polyp-benign, diverticulosis.  Recall 5 yrs per GI MD notes.  01/19/18 1 sess serr polyp at appendiceal orifice--incompletely removed.  GI referred him to gen surg for extended appendectomy.  Recall 12/2020.  [name] KNEE SURGERY     Ex baseball player.  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 LAPAROSCOPIC APPENDECTOMY N/A 03/23/2018   Procedure: LAPAROSCOPIC APPENDECTOMY;  Surgeon: Georganna Skeans, MD;  Location: [facility];  Service: General;  Laterality: N/A;  . TONSILLECTOMY AND ADENOIDECTOMY  1968    Current Facility-Administered Medications  Medication Dose Route Frequency Provider Last Rate Last Admin  . [START ON 06/27/2020] bupivacaine liposome (EXPAREL) 1.3 % injection 266 mg  20 mL Other Once Melrose Nakayama, MD      . Derrill Memo ON 06/27/2020] tranexamic  acid (CYKLOKAPRON) 2,000 mg in sodium chloride 0.9 % 50 mL Topical Application  2,706 mg Topical To OR Melrose Nakayama, MD       Current Outpatient Medications  Medication Sig Dispense Refill Last Dose  . acetaminophen (TYLENOL) 500 MG tablet Take 1,500-2,000 mg by mouth daily as needed for moderate pain or headache.     [name] aspirin EC 81 MG tablet Take 81 mg by  mouth daily. Swallow whole.     [name] atorvastatin (LIPITOR) 40 MG tablet TAKE 1 TABLET BY MOUTH EVERY DAY (Patient taking differently: Take 40 mg by mouth daily. ) 90 tablet 2   . calcium carbonate (TUMS - DOSED IN MG ELEMENTAL CALCIUM) 500 MG chewable tablet Chew 2-3 tablets by mouth daily as needed for indigestion or heartburn.     . celecoxib (CELEBREX) 200 MG capsule Take 200 mg by mouth daily as needed (pain.).      [name] lisinopril (ZESTRIL) 10 MG tablet TAKE 1 TABLET BY MOUTH EVERY DAY (Patient taking differently: Take 10 mg by mouth daily. ) 90 tablet 1   . LORazepam (ATIVAN) 0.5 MG tablet TAKE 1 TABLET (0.5 MG TOTAL) BY MOUTH DAILY AS NEEDED. (Patient taking differently: Take 0.5 mg by mouth daily as needed for sleep. ) 30 tablet 2   . Multiple Vitamin (MULTIVITAMIN WITH MINERALS) TABS tablet Take 1 tablet by mouth daily.      No Known Allergies  Social History   Tobacco Use  . Smoking status: Never Smoker  . Smokeless tobacco: Former Systems developer    Types: Chew  Substance Use Topics  . Alcohol use: Yes    Comment: recreational     Family History  Problem Relation Age of Onset  . Breast cancer Mother   . Heart disease Mother   . Arthritis Father   . Prostate cancer Father   . Stroke Father   . Hypertension Father   . Diabetes Neg Hx   . Colon cancer Neg Hx       Review of Systems  Musculoskeletal: Positive for arthralgias.       Left knee  All other systems reviewed and are negative.    Objective:  Physical Exam Constitutional:      Appearance: Normal appearance.  HENT:     Head: Normocephalic and atraumatic.     Nose: Nose normal.     Mouth/Throat:     Pharynx: Oropharynx is clear.  Eyes:     Extraocular Movements: Extraocular movements intact.  Cardiovascular:     Rate and Rhythm: Normal rate and regular rhythm.  Pulmonary:     Effort: Pulmonary effort is normal.  Abdominal:     Palpations: Abdomen is soft.  Musculoskeletal:     Cervical back: Normal range of  motion.     Comments: Left knee has 1+ effusion.  His motion is about 0-115.  He can actively sub lux his knee at 90 of flexion in and out of place.  He has some mild instability in full extension.  Opposite knee goes about 0-120 and he has no effusion there with good stability in extension.    Skin:    General: Skin is warm and dry.  Neurological:     General: No focal deficit present.     Mental Status: He is alert and oriented to person, place, and time.  Psychiatric:        Mood and Affect: Mood normal.        Behavior: Behavior normal.  Thought Content: Thought content normal.        Judgment: Judgment normal.     Vital signs in last 24 hours:    Labs:  Estimated body mass index is 28.84 kg/m as calculated from the following:   Height as of 06/14/20: 5\' 10"  (1.778 m).   Weight as of 06/14/20: 91.2 kg.  Imaging Review Plain radiographs demonstrate no degenerative joint disease of the left knee(s). The overall alignment is neutral.The bone quality appears to be good for age and reported activity level. There is  Evidence of the poly lifting off on the lateral view\.    Assessment/Plan:  End stage arthritis, left knee(s) with failed previous arthroplasty.   The patient history, physical examination, clinical judgment of the provider and imaging studies are consistent with end stage degenerative joint disease of the left knee(s), previous total knee arthroplasty. Revision total knee arthroplasty is deemed medically necessary. The treatment options including medical management, injection therapy, arthroscopy and revision arthroplasty were discussed at length. The risks and benefits of revision total knee arthroplasty were presented and reviewed. The risks due to aseptic loosening, infection, stiffness, patella tracking problems, thromboembolic complications and other imponderables were discussed. The patient acknowledged the explanation, agreed to proceed with the plan and  consent was signed. Patient is being admitted for inpatient treatment for surgery, pain control, PT, OT, prophylactic antibiotics, VTE prophylaxis, progressive ambulation and ADL's and discharge planning.The patient is planning to be discharged home with home health services

## 2020-06-27 ENCOUNTER — Encounter (HOSPITAL_COMMUNITY)
Admission: RE | Disposition: A | Discharge status: Home / Self Care | Payer: Self-pay | Source: Ambulatory Visit | Attending: Orthopaedic Surgery

## 2020-06-27 ENCOUNTER — Encounter (HOSPITAL_COMMUNITY): Payer: Self-pay | Admitting: Orthopaedic Surgery

## 2020-06-27 ENCOUNTER — Other Ambulatory Visit: Payer: Self-pay

## 2020-06-27 ENCOUNTER — Ambulatory Visit (HOSPITAL_COMMUNITY): Payer: BC Managed Care – PPO | Admitting: Certified Registered Nurse Anesthetist

## 2020-06-27 ENCOUNTER — Ambulatory Visit (HOSPITAL_COMMUNITY)
Admission: RE | Admit: 2020-06-27 | Discharge: 2020-06-27 | Disposition: A | Discharge status: Home / Self Care | Payer: BC Managed Care – PPO | Source: Ambulatory Visit | Attending: Orthopaedic Surgery | Admitting: Orthopaedic Surgery

## 2020-06-27 DIAGNOSIS — Y792 Prosthetic and other implants, materials and accessory orthopedic devices associated with adverse incidents: Secondary | ICD-10-CM | POA: Insufficient documentation

## 2020-06-27 DIAGNOSIS — Z79899 Other long term (current) drug therapy: Secondary | ICD-10-CM | POA: Insufficient documentation

## 2020-06-27 DIAGNOSIS — G8918 Other acute postprocedural pain: Secondary | ICD-10-CM | POA: Diagnosis not present

## 2020-06-27 DIAGNOSIS — Z96652 Presence of left artificial knee joint: Secondary | ICD-10-CM | POA: Diagnosis not present

## 2020-06-27 DIAGNOSIS — T84033A Mechanical loosening of internal left knee prosthetic joint, initial encounter: Secondary | ICD-10-CM | POA: Insufficient documentation

## 2020-06-27 DIAGNOSIS — K219 Gastro-esophageal reflux disease without esophagitis: Secondary | ICD-10-CM | POA: Diagnosis not present

## 2020-06-27 DIAGNOSIS — I10 Essential (primary) hypertension: Secondary | ICD-10-CM | POA: Diagnosis not present

## 2020-06-27 DIAGNOSIS — Z7982 Long term (current) use of aspirin: Secondary | ICD-10-CM | POA: Diagnosis not present

## 2020-06-27 HISTORY — PX: TOTAL KNEE REVISION: SHX996

## 2020-06-27 LAB — ABO/RH: ABO/RH(D): O POS

## 2020-06-27 LAB — TYPE AND SCREEN
ABO/RH(D): O POS
Antibody Screen: NEGATIVE

## 2020-06-27 SURGERY — TOTAL KNEE REVISION
Anesthesia: Monitor Anesthesia Care | Site: Knee | Laterality: Left

## 2020-06-27 MED ORDER — DEXAMETHASONE SODIUM PHOSPHATE 10 MG/ML IJ SOLN
INTRAMUSCULAR | Status: DC | PRN
  Administered 2020-06-27: 5 mg via INTRAVENOUS

## 2020-06-27 MED ORDER — METHOCARBAMOL 500 MG PO TABS: 500.0000 mg | ORAL_TABLET | Freq: Four times a day (QID) | ORAL | Status: DC | PRN

## 2020-06-27 MED ORDER — TRANEXAMIC ACID-NACL 1000-0.7 MG/100ML-% IV SOLN: 1000.0000 mg | Freq: Once | INTRAVENOUS | Status: DC

## 2020-06-27 MED ORDER — METHOCARBAMOL 500 MG IVPB - SIMPLE MED
500.0000 mg | Freq: Four times a day (QID) | INTRAVENOUS | Status: DC | PRN
  Administered 2020-06-27: 500 mg via INTRAVENOUS

## 2020-06-27 MED ORDER — ONDANSETRON HCL 4 MG/2ML IJ SOLN
INTRAMUSCULAR | Status: DC | PRN
  Administered 2020-06-27: 4 mg via INTRAVENOUS

## 2020-06-27 MED ORDER — ROPIVACAINE HCL 5 MG/ML IJ SOLN
INTRAMUSCULAR | Status: DC | PRN
  Administered 2020-06-27: 20 mL via PERINEURAL

## 2020-06-27 MED ORDER — LIDOCAINE HCL (CARDIAC) PF 100 MG/5ML IV SOSY
PREFILLED_SYRINGE | INTRAVENOUS | Status: DC | PRN
  Administered 2020-06-27: 40 mg via INTRAVENOUS

## 2020-06-27 MED ORDER — OXYCODONE HCL 5 MG PO TABS: 5.0000 mg | ORAL_TABLET | ORAL | Status: DC | PRN

## 2020-06-27 MED ORDER — TRANEXAMIC ACID-NACL 1000-0.7 MG/100ML-% IV SOLN
1000.0000 mg | INTRAVENOUS | Status: AC
  Administered 2020-06-27: 1000 mg via INTRAVENOUS
  Filled 2020-06-27: qty 100

## 2020-06-27 MED ORDER — PHENYLEPHRINE 40 MCG/ML (10ML) SYRINGE FOR IV PUSH (FOR BLOOD PRESSURE SUPPORT)
PREFILLED_SYRINGE | INTRAVENOUS | Status: AC
  Filled 2020-06-27: qty 10

## 2020-06-27 MED ORDER — ACETAMINOPHEN 500 MG PO TABS: 1000.0000 mg | ORAL_TABLET | Freq: Once | ORAL | Status: DC

## 2020-06-27 MED ORDER — METHOCARBAMOL 500 MG IVPB - SIMPLE MED
INTRAVENOUS | Status: AC
  Filled 2020-06-27: qty 50

## 2020-06-27 MED ORDER — AMISULPRIDE (ANTIEMETIC) 5 MG/2ML IV SOLN: 10.0000 mg | Freq: Once | INTRAVENOUS | Status: DC | PRN

## 2020-06-27 MED ORDER — ACETAMINOPHEN 325 MG PO TABS: 325.0000 mg | ORAL_TABLET | Freq: Four times a day (QID) | ORAL | Status: DC | PRN

## 2020-06-27 MED ORDER — SODIUM CHLORIDE 0.9 % IR SOLN
Status: DC | PRN
  Administered 2020-06-27: 3000 mL

## 2020-06-27 MED ORDER — KETOROLAC TROMETHAMINE 15 MG/ML IJ SOLN: 15.0000 mg | Freq: Four times a day (QID) | INTRAMUSCULAR | Status: DC

## 2020-06-27 MED ORDER — BUPIVACAINE-EPINEPHRINE 0.5% -1:200000 IJ SOLN
INTRAMUSCULAR | Status: AC
  Filled 2020-06-27: qty 1

## 2020-06-27 MED ORDER — METOCLOPRAMIDE HCL 5 MG PO TABS
5.0000 mg | ORAL_TABLET | Freq: Three times a day (TID) | ORAL | Status: DC | PRN
  Filled 2020-06-27: qty 2

## 2020-06-27 MED ORDER — OXYCODONE HCL 5 MG PO TABS
ORAL_TABLET | ORAL | Status: AC
  Administered 2020-06-27: 10 mg via ORAL
  Filled 2020-06-27: qty 2

## 2020-06-27 MED ORDER — POVIDONE-IODINE 10 % EX SWAB
2.0000 "application " | Freq: Once | CUTANEOUS | Status: AC
  Administered 2020-06-27: 2 via TOPICAL

## 2020-06-27 MED ORDER — ONDANSETRON HCL 4 MG/2ML IJ SOLN
INTRAMUSCULAR | Status: AC
  Filled 2020-06-27: qty 2

## 2020-06-27 MED ORDER — CLONIDINE HCL (ANALGESIA) 100 MCG/ML EP SOLN
EPIDURAL | Status: DC | PRN
  Administered 2020-06-27: 50 ug

## 2020-06-27 MED ORDER — HYDROMORPHONE HCL 1 MG/ML IJ SOLN: 0.5000 mg | INTRAMUSCULAR | Status: DC | PRN

## 2020-06-27 MED ORDER — FENTANYL CITRATE (PF) 100 MCG/2ML IJ SOLN
50.0000 ug | INTRAMUSCULAR | Status: DC
  Administered 2020-06-27: 50 ug via INTRAVENOUS
  Filled 2020-06-27: qty 2

## 2020-06-27 MED ORDER — SODIUM CHLORIDE (PF) 0.9 % IJ SOLN
INTRAMUSCULAR | Status: AC
  Filled 2020-06-27: qty 50

## 2020-06-27 MED ORDER — DEXAMETHASONE SODIUM PHOSPHATE 10 MG/ML IJ SOLN
INTRAMUSCULAR | Status: AC
  Filled 2020-06-27: qty 1

## 2020-06-27 MED ORDER — TIZANIDINE HCL 4 MG PO TABS: 4.0000 mg | ORAL_TABLET | Freq: Four times a day (QID) | ORAL | 1 refills | Status: DC | PRN

## 2020-06-27 MED ORDER — PROPOFOL 1000 MG/100ML IV EMUL
INTRAVENOUS | Status: AC
  Filled 2020-06-27: qty 200

## 2020-06-27 MED ORDER — STERILE WATER FOR IRRIGATION IR SOLN
Status: DC | PRN
  Administered 2020-06-27: 2000 mL

## 2020-06-27 MED ORDER — CEFAZOLIN SODIUM-DEXTROSE 2-4 GM/100ML-% IV SOLN: 2.0000 g | Freq: Four times a day (QID) | INTRAVENOUS | Status: DC

## 2020-06-27 MED ORDER — FENTANYL CITRATE (PF) 100 MCG/2ML IJ SOLN: 25.0000 ug | INTRAMUSCULAR | Status: DC | PRN

## 2020-06-27 MED ORDER — BUPIVACAINE LIPOSOME 1.3 % IJ SUSP
20.0000 mL | Freq: Once | INTRAMUSCULAR | Status: AC
  Administered 2020-06-27: 20 mL
  Filled 2020-06-27: qty 20

## 2020-06-27 MED ORDER — BUPIVACAINE IN DEXTROSE 0.75-8.25 % IT SOLN
INTRATHECAL | Status: DC | PRN
  Administered 2020-06-27: 1.8 mL via INTRATHECAL

## 2020-06-27 MED ORDER — CEFAZOLIN SODIUM-DEXTROSE 2-4 GM/100ML-% IV SOLN
2.0000 g | INTRAVENOUS | Status: AC
  Administered 2020-06-27: 2 g via INTRAVENOUS
  Filled 2020-06-27: qty 100

## 2020-06-27 MED ORDER — BUPIVACAINE-EPINEPHRINE 0.5% -1:200000 IJ SOLN
INTRAMUSCULAR | Status: DC | PRN
  Administered 2020-06-27: 30 mL

## 2020-06-27 MED ORDER — OXYCODONE HCL 5 MG PO TABS: 10.0000 mg | ORAL_TABLET | ORAL | Status: DC | PRN

## 2020-06-27 MED ORDER — PHENYLEPHRINE 40 MCG/ML (10ML) SYRINGE FOR IV PUSH (FOR BLOOD PRESSURE SUPPORT)
PREFILLED_SYRINGE | INTRAVENOUS | Status: DC | PRN
  Administered 2020-06-27 (×4): 80 ug via INTRAVENOUS

## 2020-06-27 MED ORDER — KETOROLAC TROMETHAMINE 15 MG/ML IJ SOLN
INTRAMUSCULAR | Status: AC
  Administered 2020-06-27: 15 mg via INTRAVENOUS
  Filled 2020-06-27: qty 1

## 2020-06-27 MED ORDER — ASPIRIN EC 81 MG PO TBEC: 81.0000 mg | DELAYED_RELEASE_TABLET | Freq: Two times a day (BID) | ORAL | 0 refills | Status: DC

## 2020-06-27 MED ORDER — 0.9 % SODIUM CHLORIDE (POUR BTL) OPTIME
TOPICAL | Status: DC | PRN
  Administered 2020-06-27: 1000 mL

## 2020-06-27 MED ORDER — LACTATED RINGERS IV SOLN: INTRAVENOUS | Status: DC

## 2020-06-27 MED ORDER — LACTATED RINGERS IV BOLUS
250.0000 mL | Freq: Once | INTRAVENOUS | Status: AC
  Administered 2020-06-27: 250 mL via INTRAVENOUS

## 2020-06-27 MED ORDER — SODIUM CHLORIDE 0.9 % IJ SOLN
INTRAMUSCULAR | Status: DC | PRN
  Administered 2020-06-27: 30 mL

## 2020-06-27 MED ORDER — PROPOFOL 500 MG/50ML IV EMUL
INTRAVENOUS | Status: DC | PRN
  Administered 2020-06-27: 120 ug/kg/min via INTRAVENOUS

## 2020-06-27 MED ORDER — PROPOFOL 10 MG/ML IV BOLUS
INTRAVENOUS | Status: DC | PRN
  Administered 2020-06-27 (×2): 10 mg via INTRAVENOUS

## 2020-06-27 MED ORDER — ONDANSETRON HCL 4 MG PO TABS
4.0000 mg | ORAL_TABLET | Freq: Four times a day (QID) | ORAL | Status: DC | PRN
  Filled 2020-06-27: qty 1

## 2020-06-27 MED ORDER — METOCLOPRAMIDE HCL 5 MG/ML IJ SOLN: 5.0000 mg | Freq: Three times a day (TID) | INTRAMUSCULAR | Status: DC | PRN

## 2020-06-27 MED ORDER — LACTATED RINGERS IV BOLUS
500.0000 mL | Freq: Once | INTRAVENOUS | Status: AC
  Administered 2020-06-27: 500 mL via INTRAVENOUS

## 2020-06-27 MED ORDER — ONDANSETRON HCL 4 MG/2ML IJ SOLN: 4.0000 mg | Freq: Four times a day (QID) | INTRAMUSCULAR | Status: DC | PRN

## 2020-06-27 MED ORDER — ACETAMINOPHEN 500 MG PO TABS: 1000.0000 mg | ORAL_TABLET | Freq: Four times a day (QID) | ORAL | Status: DC

## 2020-06-27 MED ORDER — OXYCODONE-ACETAMINOPHEN 5-325 MG PO TABS
1.0000 | ORAL_TABLET | Freq: Four times a day (QID) | ORAL | 0 refills | Status: DC | PRN
Start: 2020-06-27 — End: 2020-12-20

## 2020-06-27 MED ORDER — MIDAZOLAM HCL 2 MG/2ML IJ SOLN
1.0000 mg | INTRAMUSCULAR | Status: DC
  Administered 2020-06-27: 1 mg via INTRAVENOUS
  Filled 2020-06-27: qty 2

## 2020-06-27 SURGICAL SUPPLY — 56 items
BAG ZIPLOCK 12X15 (MISCELLANEOUS) ×3 IMPLANT
BASE TIBIAL ATTUNE SZ7 RP REV (Joint) ×1 IMPLANT
BLADE FLEX CHISEL 8 2.7 (MISCELLANEOUS) ×3 IMPLANT
BLADE SURG SZ10 CARB STEEL (BLADE) ×6 IMPLANT
BNDG ELASTIC 4X5.8 VLCR STR LF (GAUZE/BANDAGES/DRESSINGS) ×3 IMPLANT
BNDG ELASTIC 6X5.8 VLCR STR LF (GAUZE/BANDAGES/DRESSINGS) ×3 IMPLANT
BNDG GAUZE ELAST 4 BULKY (GAUZE/BANDAGES/DRESSINGS) ×6 IMPLANT
BOOTIES KNEE HIGH SLOAN (MISCELLANEOUS) ×3 IMPLANT
COVER WAND RF STERILE (DRAPES) IMPLANT
CUFF TOURN SGL QUICK 34 (TOURNIQUET CUFF) ×2
CUFF TRNQT CYL 34X4.125X (TOURNIQUET CUFF) ×1 IMPLANT
DECANTER SPIKE VIAL GLASS SM (MISCELLANEOUS) IMPLANT
DRAPE ORTHO SPLIT 77X108 STRL (DRAPES) ×4
DRAPE SURG ORHT 6 SPLT 77X108 (DRAPES) ×2 IMPLANT
DRAPE U-SHAPE 47X51 STRL (DRAPES) ×3 IMPLANT
DRSG ADAPTIC 3X8 NADH LF (GAUZE/BANDAGES/DRESSINGS) ×3 IMPLANT
DRSG CURAD 3X16 NADH (PACKING) ×3 IMPLANT
DRSG PAD ABDOMINAL 8X10 ST (GAUZE/BANDAGES/DRESSINGS) ×6 IMPLANT
ELECT REM PT RETURN 15FT ADLT (MISCELLANEOUS) ×3 IMPLANT
EVACUATOR 1/8 PVC DRAIN (DRAIN) ×3 IMPLANT
GLOVE BIO SURGEON STRL SZ8 (GLOVE) ×6 IMPLANT
GLOVE BIOGEL PI IND STRL 8 (GLOVE) ×2 IMPLANT
GLOVE BIOGEL PI INDICATOR 8 (GLOVE) ×4
GOWN STRL REUS W/TWL XL LVL3 (GOWN DISPOSABLE) ×6 IMPLANT
HANDPIECE INTERPULSE COAX TIP (DISPOSABLE) ×2
HOLDER FOLEY CATH W/STRAP (MISCELLANEOUS) ×3 IMPLANT
IMMOBILIZER KNEE 20 (SOFTGOODS) ×3
IMMOBILIZER KNEE 20 THIGH 36 (SOFTGOODS) ×1 IMPLANT
INSERT TIB ATTUNE RP SZ6X20 (Insert) ×3 IMPLANT
KIT BASIN OR (CUSTOM PROCEDURE TRAY) ×3 IMPLANT
KIT TURNOVER KIT A (KITS) IMPLANT
NDL SAFETY ECLIPSE 18X1.5 (NEEDLE) IMPLANT
NEEDLE HYPO 18GX1.5 SHARP (NEEDLE)
NS IRRIG 1000ML POUR BTL (IV SOLUTION) ×3 IMPLANT
PACK TOTAL KNEE CUSTOM (KITS) IMPLANT
PAD CAST 4YDX4 CTTN HI CHSV (CAST SUPPLIES) ×2 IMPLANT
PADDING CAST COTTON 4X4 STRL (CAST SUPPLIES) ×4
PADDING CAST COTTON 6X4 STRL (CAST SUPPLIES) ×6 IMPLANT
PENCIL SMOKE EVACUATOR (MISCELLANEOUS) IMPLANT
PIN STEINMAN FIXATION KNEE (PIN) ×3 IMPLANT
PROTECTOR NERVE ULNAR (MISCELLANEOUS) ×3 IMPLANT
SET HNDPC FAN SPRY TIP SCT (DISPOSABLE) ×1 IMPLANT
SLEEVE TIB ATTUNE FP 61 (Knees) ×3 IMPLANT
SPONGE LAP 18X18 RF (DISPOSABLE) ×3 IMPLANT
STAPLER VISISTAT 35W (STAPLE) IMPLANT
STEM STR ATTUNE PF 20X60 (Knees) ×3 IMPLANT
SUT ETHIBOND NAB CT1 #1 30IN (SUTURE) ×6 IMPLANT
SUT VIC AB 0 CT1 36 (SUTURE) ×3 IMPLANT
SUT VIC AB 2-0 CT1 27 (SUTURE) ×2
SUT VIC AB 2-0 CT1 TAPERPNT 27 (SUTURE) ×1 IMPLANT
SUT VICRYL AB 3-0 FS1 BRD 27IN (SUTURE) ×3 IMPLANT
SYR 3ML LL SCALE MARK (SYRINGE) IMPLANT
TIBIAL BASE ATTUNE SZ7 RP REV (Joint) ×3 IMPLANT
TRAY FOLEY MTR SLVR 16FR STAT (SET/KITS/TRAYS/PACK) ×3 IMPLANT
WATER STERILE IRR 1000ML POUR (IV SOLUTION) ×3 IMPLANT
WRAP KNEE MAXI GEL POST OP (GAUZE/BANDAGES/DRESSINGS) ×3 IMPLANT

## 2020-06-27 NOTE — Anesthesia Preprocedure Evaluation (Signed)
Anesthesia Evaluation  Patient identified by MRN, date of birth, ID band Patient awake    Reviewed: Allergy & Precautions, NPO status , Patient's Chart, lab work & pertinent test results  Airway Mallampati: II  TM Distance: >3 FB Neck ROM: Full    Dental  (+) Dental Advisory Given   Pulmonary neg pulmonary ROS,    breath sounds clear to auscultation       Cardiovascular hypertension, Pt. on medications  Rhythm:Regular Rate:Normal     Neuro/Psych negative neurological ROS     GI/Hepatic Neg liver ROS, GERD  ,  Endo/Other  negative endocrine ROS  Renal/GU negative Renal ROS     Musculoskeletal  (+) Arthritis ,   Abdominal   Peds  Hematology negative hematology ROS (+)   Anesthesia Other Findings   Reproductive/Obstetrics                             Lab Results  Component Value Date   WBC 7.1 06/14/2020   HGB 15.9 06/14/2020   HCT 48.1 06/14/2020   MCV 89.6 06/14/2020   PLT 248 06/14/2020   Lab Results  Component Value Date   CREATININE 0.81 06/14/2020   BUN 15 06/14/2020   NA 138 06/14/2020   K 4.5 06/14/2020   CL 102 06/14/2020   CO2 32 06/14/2020    Anesthesia Physical Anesthesia Plan  ASA: II  Anesthesia Plan: Spinal and MAC   Post-op Pain Management:    Induction:   PONV Risk Score and Plan: 2 and Propofol infusion, Ondansetron and Treatment may vary due to age or medical condition  Airway Management Planned: Natural Airway and Simple Face Mask  Additional Equipment:   Intra-op Plan:   Post-operative Plan:   Informed Consent: I have reviewed the patients History and Physical, chart, labs and discussed the procedure including the risks, benefits and alternatives for the proposed anesthesia with the patient or authorized representative who has indicated his/her understanding and acceptance.       Plan Discussed with: CRNA  Anesthesia Plan Comments:          Anesthesia Quick Evaluation

## 2020-06-27 NOTE — Interval H&P Note (Signed)
History and Physical Interval Note:  06/27/2020 12:12 PM  Billy Bates  has presented today for surgery, with the diagnosis of LOOSE LEFT KNEE REPLACEMENT.  The various methods of treatment have been discussed with the patient and family. After consideration of risks, benefits and other options for treatment, the patient has consented to  Procedure(s): LEFT TOTAL KNEE REVISION POLY EXCHANGE (Left) as a surgical intervention.  The patient's history has been reviewed, patient examined, no change in status, stable for surgery.  I have reviewed the patient's chart and labs.  Questions were answered to the patient's satisfaction.     [name]

## 2020-06-27 NOTE — Anesthesia Procedure Notes (Signed)
Anesthesia Regional Block: Adductor canal block   Pre-Anesthetic Checklist: ,, timeout performed, Correct Patient, Correct Site, Correct Laterality, Correct Procedure, Correct Position, site marked, Risks and benefits discussed,  Surgical consent,  Pre-op evaluation,  At surgeon's request and post-op pain management  Laterality: Left  Prep: chloraprep       Needles:  Injection technique: Single-shot  Needle Type: Echogenic Needle     Needle Length: 9cm  Needle Gauge: 21     Additional Needles:   Procedures:,,,, ultrasound used (permanent image in chart),,,,  Narrative:  Start time: 06/27/2020 12:15 PM End time: 06/27/2020 12:21 PM Injection made incrementally with aspirations every 5 mL.  Performed by: Personally  Anesthesiologist: Suzette Battiest, MD

## 2020-06-27 NOTE — Op Note (Signed)
Shloima Clinch [place] [phone] 06/27/2020   PRE-OP DIAGNOSIS: loose left TKR  POST-OP DIAGNOSIS: same  PROCEDURE: left TKR revision  ANESTHESIA: spinal and block   [name]   Dictation #:  [id number]

## 2020-06-27 NOTE — Evaluation (Signed)
Physical Therapy Evaluation Patient Details Name: Billy Bates MRN: [id number] DOB: August 10, 1960 Today's Date: 06/27/2020   History of Present Illness  Patient is 59 y.o. male s/p Lt TKR on 06/27/20 with PMH significant for OA, HTN,  HLD, GERD.  Clinical Impression  Billy Bates is a 59 y.o. male POD 0 s/p Lt TKR. Patient reports independence with mobility at baseline. Patient is now limited by functional impairments (see PT problem list below) and requires ming uard for transfers and gait with RW. Patient was able to ambulate ~140 feet with RW and min guard and cues for safe walker management. Patient educated on safe sequencing for stair mobility and verbalized safe guarding position for people assisting with mobility. Patient instructed in exercises to facilitate ROM and circulation. Patient will benefit from continued skilled PT interventions to address impairments and progress towards PLOF. Patient has met mobility goals at adequate level for discharge home; will continue to follow if pt continues acute stay to progress towards Mod I goals.     Follow Up Recommendations Follow surgeon's recommendation for DC plan and follow-up therapies;Home health PT;Outpatient PT    Equipment Recommendations  None recommended by PT    Recommendations for Other Services       Precautions / Restrictions Precautions Precautions: Fall Restrictions Weight Bearing Restrictions: No Other Position/Activity Restrictions: WBAT      Mobility  Bed Mobility Overal bed mobility: Needs Assistance Bed Mobility: Supine to Sit;Sit to Supine     Supine to sit: Supervision;HOB elevated Sit to supine: Supervision;HOB elevated   General bed mobility comments: no assist or cues required, pt taking extra time.    Transfers Overall transfer level: Needs assistance Equipment used: Rolling walker (2 wheeled) Transfers: Sit to/from Stand Sit to Stand: Min guard         General transfer comment:  cues for safe technique with RW, pt initiated power up without assist and steady in standing.  Ambulation/Gait Ambulation/Gait assistance: Min guard Gait Distance (Feet): 140 Feet Assistive device: Rolling walker (2 wheeled) Gait Pattern/deviations: Step-to pattern;Decreased stride length;Decreased weight shift to left Gait velocity: decr   General Gait Details: cues for safe step to pattern and proximity to RW, no overt LOB or buckling at Lt knee  Stairs Stairs: Yes Stairs assistance: Min assist;Min guard Stair Management: No rails;Step to pattern;Backwards;With walker Number of Stairs: 3 General stair comments: cues for pattern "up with good, down with bad" and for walker position. assist from NT to demonstrate assist pt's wife should provide. no buckling or LOB noted.  Wheelchair Mobility    Modified Rankin (Stroke Patients Only)       Balance Overall balance assessment: Needs assistance Sitting-balance support: Feet supported Sitting balance-Leahy Scale: Good     Standing balance support: Bilateral upper extremity supported;During functional activity Standing balance-Leahy Scale: Fair                               Pertinent Vitals/Pain Pain Assessment: 0-10 Pain Score: 0-No pain Pain Location: Lt knee Pain Descriptors / Indicators: Aching;Discomfort Pain Intervention(s): Limited activity within patient's tolerance;Monitored during session;Repositioned;Ice applied    Home Living Family/patient expects to be discharged to:: Private residence Living Arrangements: Spouse/significant other Available Help at Discharge: Family Type of Home: House Home Access: Stairs to enter Entrance Stairs-Rails: None Entrance Stairs-Number of Steps: 3 Home Layout: Two level;Full bath on main level;Able to live on main level with bedroom/bathroom Home Equipment: Gilford Rile -  2 wheels;Cane - single point      Prior Function Level of Independence: Independent                Hand Dominance   Dominant Hand: Right    Extremity/Trunk Assessment   Upper Extremity Assessment Upper Extremity Assessment: Overall WFL for tasks assessed    Lower Extremity Assessment Lower Extremity Assessment: LLE deficits/detail LLE Deficits / Details: good quad activation, no extensor lag, 3/5 or greater throughout LLE Sensation: WNL LLE Coordination: WNL    Cervical / Trunk Assessment Cervical / Trunk Assessment: Normal  Communication   Communication: No difficulties  Cognition Arousal/Alertness: Awake/alert Behavior During Therapy: WFL for tasks assessed/performed Overall Cognitive Status: Within Functional Limits for tasks assessed                                        General Comments      Exercises Total Joint Exercises Ankle Circles/Pumps: AROM;Both;20 reps;Supine Quad Sets: AROM;Left;Other reps (comment);Supine (3) Towel Squeeze: AROM;Left;Other reps (comment);Supine (3) Short Arc Quad: AROM;Left;Other reps (comment);Supine (3) Heel Slides: AROM;Left;Other reps (comment);Supine (3) Hip ABduction/ADduction: AROM;Left;Other reps (comment);Supine (3) Straight Leg Raises: AROM;Left;Other reps (comment);Supine (3)   Assessment/Plan    PT Assessment Patient needs continued PT services  PT Problem List Decreased strength;Decreased range of motion;Decreased activity tolerance;Decreased balance;Decreased mobility;Decreased knowledge of use of DME;Decreased knowledge of precautions       PT Treatment Interventions DME instruction;Gait training;Stair training;Functional mobility training;Therapeutic activities;Therapeutic exercise;Balance training;Patient/family education    PT Goals (Current goals can be found in the Care Plan section)  Acute Rehab PT Goals Patient Stated Goal: return home tonight PT Goal Formulation: With patient Time For Goal Achievement: 07/04/20 Potential to Achieve Goals: Good    Frequency 7X/week   Barriers  to discharge        Co-evaluation               AM-PAC PT "6 Clicks" Mobility  Outcome Measure Help needed turning from your back to your side while in a flat bed without using bedrails?: None Help needed moving from lying on your back to sitting on the side of a flat bed without using bedrails?: None Help needed moving to and from a bed to a chair (including a wheelchair)?: A Little Help needed standing up from a chair using your arms (e.g., wheelchair or bedside chair)?: A Little Help needed to walk in hospital room?: A Little Help needed climbing 3-5 steps with a railing? : A Little 6 Click Score: 20    End of Session Equipment Utilized During Treatment: Gait belt Activity Tolerance: Patient tolerated treatment well Patient left: in bed;with call bell/phone within reach Nurse Communication: Mobility status PT Visit Diagnosis: Muscle weakness (generalized) (M62.81);Difficulty in walking, not elsewhere classified (R26.2)    Time: 6295-2841 PT Time Calculation (min) (ACUTE ONLY): 40 min   Charges:   PT Evaluation $PT Eval Low Complexity: 1 Low PT Treatments $Gait Training: 8-22 mins $Therapeutic Exercise: 8-22 mins        Verner Mould, DPT Acute Rehabilitation Services  Office [phone] Pager [phone]  06/27/2020 7:03 PM

## 2020-06-27 NOTE — Progress Notes (Signed)
Assisted Dr. Rob Fitzgerald with left, ultrasound guided, adductor canal block. Side rails up, monitors on throughout procedure. See vital signs in flow sheet. Tolerated Procedure well.  

## 2020-06-27 NOTE — Anesthesia Procedure Notes (Signed)
Spinal  Patient location during procedure: OR Start time: 06/27/2020 1:33 PM End time: 06/27/2020 1:36 PM Staffing Performed: resident/CRNA  Anesthesiologist: Suzette Battiest, MD Resident/CRNA: Raenette Rover, CRNA Preanesthetic Checklist Completed: patient identified, IV checked, site marked, risks and benefits discussed, surgical consent, monitors and equipment checked, pre-op evaluation and timeout performed Spinal Block Patient position: sitting Prep: DuraPrep Patient monitoring: blood pressure, continuous pulse ox and heart rate Approach: midline Location: L3-4 Injection technique: single-shot Needle Needle type: Pencan  Needle gauge: 24 G Assessment Sensory level: T6

## 2020-06-27 NOTE — Transfer of Care (Signed)
Immediate Anesthesia Transfer of Care Note  Patient: Billy Bates  Procedure(s) Performed: LEFT TOTAL KNEE REVISION (POLY AND TIBIA) (Left Knee)  Patient Location: PACU  Anesthesia Type:Spinal  Level of Consciousness: drowsy  Airway & Oxygen Therapy: Patient Spontanous Breathing and Patient connected to face mask oxygen  Post-op Assessment: Report given to RN and Post -op Vital signs reviewed and stable  Post vital signs: Reviewed and stable  Last Vitals:  Vitals Value Taken Time  BP 117/73 06/27/20 1620  Temp    Pulse 65 06/27/20 1621  Resp 14 06/27/20 1621  SpO2 99 % 06/27/20 1621  Vitals shown include unvalidated device data.  Last Pain:  Vitals:   06/27/20 1118  TempSrc: Oral  PainSc:          Complications: No complications documented.

## 2020-06-27 NOTE — Anesthesia Postprocedure Evaluation (Signed)
Anesthesia Post Note  Patient: Billy Bates  Procedure(s) Performed: LEFT TOTAL KNEE REVISION (POLY AND TIBIA) (Left Knee)     Patient location during evaluation: PACU Anesthesia Type: MAC Level of consciousness: awake and alert Pain management: pain level controlled Vital Signs Assessment: post-procedure vital signs reviewed and stable Respiratory status: spontaneous breathing and respiratory function stable Cardiovascular status: blood pressure returned to baseline and stable Postop Assessment: spinal receding Anesthetic complications: no   No complications documented.  Last Vitals:  Vitals:   06/27/20 1745 06/27/20 1800  BP: 131/84 (!) 143/84  Pulse: (!) 49 61  Resp: 16 16  Temp:    SpO2: 100% 100%    Last Pain:  Vitals:   06/27/20 1800  TempSrc:   PainSc: 4                  Tiajuana Amass

## 2020-06-28 ENCOUNTER — Encounter (HOSPITAL_COMMUNITY): Payer: Self-pay | Admitting: Orthopaedic Surgery

## 2020-06-28 NOTE — Op Note (Signed)
NAME: Acre, Keonta W. MEDICAL RECORD [id number] ACCOUNT [id number] DATE OF BIRTH:06-10-1961 FACILITY: [facility] LOCATION: [facility] PHYSICIAN:Tifanny Dollens Autumn Patty, MD  OPERATIVE REPORT  DATE OF PROCEDURE:  06/27/2020  PREOPERATIVE DIAGNOSIS:  Loose left TKR.  POSTOPERATIVE DIAGNOSIS:  Loose left TKR.  PROCEDURE:  Left TKR revision, tibial component.  ANESTHESIA:  Spinal and block.  ATTENDING SURGEON:  Melrose Nakayama, MD  ASSISTANT:  Loni Dolly, PA  INDICATIONS:  The patient is a 59 year old man, about 6 months out from a left knee replacement.  He has persisted with some instability in both flexion and extension.  This has persisted despite bracing.  He is offered a revision to a more stable  construct.  Informed operative consent was obtained after discussion of possible complications including reaction to anesthesia, infection, DVT, neurovascular injury.  SUMMARY OF FINDINGS AND PROCEDURE:  Under spinal anesthesia and a block, through his old incision, a revision left TKR was performed.  We removed the tibial component and placed a stemmed component, which was press fit.  We then placed a new size 20  spacer.  This seemed to give [place] good stability in flexion and extension.  He was closed primarily and scheduled to potentially go home same day, though he might stay overnight.  Loni Dolly assisted throughout and was invaluable to the completion of the  case in that he helped retract and position and close simultaneously to help minimize OR time.  DESCRIPTION OF PROCEDURE:  The patient was taken to the operating suite where spinal anesthetic was applied without difficulty.  He was also given a block in the preanesthesia area.  He was positioned supine and prepped and draped in normal sterile  fashion.  After the administration of preoperative IV Kefzol and TXA and an appropriate timeout, the left leg was elevated, exsanguinated, and tourniquet inflated about the thigh.  We utilized his  old incision with dissection down to the extensor  mechanism.  A medial parapatellar incision was again used.  The kneecap was flipped.  We removed some scar tissue and the tibial spacer came out easily.  I then used a small saw and flexible osteotomes to remove the tibial component without significant  bone loss.  We removed some residual cement.  We then reamed intramedullary and made a revision cut on the tibia.  We used the DePuy system and broached up to a size 61 sleeve.  We placed a size 7 rotating platform Attune plate in appropriate rotation  and performed trial reduction.  The 20 spacer seemed to give [place] good stability in both flexion and extension.  The patella tracked well.  The trial components were removed followed by pulsatile lavage irrigation of the bony surface.  We then placed the  size 60 x 20 stem with a 61 porous coated sleeve on the size 7 rotating platform Attune system.  This was placed into the tibia, and we seemed to have excellent fixation.  We then trialed and ended up with a size 20 spacer.  The wound was thoroughly  irrigated, followed by injection of some Marcaine with epinephrine and Exparel.  The tourniquet was deflated and a small amount of bleeding was easily controlled with some pressure and Bovie cautery.  We then reapproximated the extensor mechanism with  interrupted sutures of Ethibond.  Subcutaneous tissues were reapproximated in several layers followed by subcuticular closure with an absorbable stitch.  Some Steri-Strips were applied followed by dry gauze and a loose Ace wrap.  Estimated blood loss and  intraoperative fluids can be obtained from anesthesia records as can accurate tourniquet time, which was close to an hour and a half.  DISPOSITION:  The patient was taken to recovery room in stable condition.  Plans were for him to potentially go home same day, though he might stay overnight.  HN/NUANCE  D:06/27/2020 T:06/28/2020 JOB:[id number]

## 2020-06-29 DIAGNOSIS — K219 Gastro-esophageal reflux disease without esophagitis: Secondary | ICD-10-CM | POA: Diagnosis not present

## 2020-06-29 DIAGNOSIS — Z7982 Long term (current) use of aspirin: Secondary | ICD-10-CM | POA: Diagnosis not present

## 2020-06-29 DIAGNOSIS — T84093D Other mechanical complication of internal left knee prosthesis, subsequent encounter: Secondary | ICD-10-CM | POA: Diagnosis not present

## 2020-06-29 DIAGNOSIS — E785 Hyperlipidemia, unspecified: Secondary | ICD-10-CM | POA: Diagnosis not present

## 2020-06-29 DIAGNOSIS — Z6828 Body mass index (BMI) 28.0-28.9, adult: Secondary | ICD-10-CM | POA: Diagnosis not present

## 2020-06-29 DIAGNOSIS — E669 Obesity, unspecified: Secondary | ICD-10-CM | POA: Diagnosis not present

## 2020-06-29 DIAGNOSIS — Z87891 Personal history of nicotine dependence: Secondary | ICD-10-CM | POA: Diagnosis not present

## 2020-06-29 DIAGNOSIS — I10 Essential (primary) hypertension: Secondary | ICD-10-CM | POA: Diagnosis not present

## 2020-06-30 DIAGNOSIS — Z6828 Body mass index (BMI) 28.0-28.9, adult: Secondary | ICD-10-CM | POA: Diagnosis not present

## 2020-06-30 DIAGNOSIS — Z87891 Personal history of nicotine dependence: Secondary | ICD-10-CM | POA: Diagnosis not present

## 2020-06-30 DIAGNOSIS — Z7982 Long term (current) use of aspirin: Secondary | ICD-10-CM | POA: Diagnosis not present

## 2020-06-30 DIAGNOSIS — I10 Essential (primary) hypertension: Secondary | ICD-10-CM | POA: Diagnosis not present

## 2020-06-30 DIAGNOSIS — E669 Obesity, unspecified: Secondary | ICD-10-CM | POA: Diagnosis not present

## 2020-06-30 DIAGNOSIS — T84093D Other mechanical complication of internal left knee prosthesis, subsequent encounter: Secondary | ICD-10-CM | POA: Diagnosis not present

## 2020-06-30 DIAGNOSIS — E785 Hyperlipidemia, unspecified: Secondary | ICD-10-CM | POA: Diagnosis not present

## 2020-06-30 DIAGNOSIS — K219 Gastro-esophageal reflux disease without esophagitis: Secondary | ICD-10-CM | POA: Diagnosis not present

## 2020-07-01 DIAGNOSIS — E669 Obesity, unspecified: Secondary | ICD-10-CM | POA: Diagnosis not present

## 2020-07-01 DIAGNOSIS — Z7982 Long term (current) use of aspirin: Secondary | ICD-10-CM | POA: Diagnosis not present

## 2020-07-01 DIAGNOSIS — Z87891 Personal history of nicotine dependence: Secondary | ICD-10-CM | POA: Diagnosis not present

## 2020-07-01 DIAGNOSIS — E785 Hyperlipidemia, unspecified: Secondary | ICD-10-CM | POA: Diagnosis not present

## 2020-07-01 DIAGNOSIS — Z6828 Body mass index (BMI) 28.0-28.9, adult: Secondary | ICD-10-CM | POA: Diagnosis not present

## 2020-07-01 DIAGNOSIS — K219 Gastro-esophageal reflux disease without esophagitis: Secondary | ICD-10-CM | POA: Diagnosis not present

## 2020-07-01 DIAGNOSIS — I10 Essential (primary) hypertension: Secondary | ICD-10-CM | POA: Diagnosis not present

## 2020-07-01 DIAGNOSIS — T84093D Other mechanical complication of internal left knee prosthesis, subsequent encounter: Secondary | ICD-10-CM | POA: Diagnosis not present

## 2020-07-03 DIAGNOSIS — I10 Essential (primary) hypertension: Secondary | ICD-10-CM | POA: Diagnosis not present

## 2020-07-03 DIAGNOSIS — E669 Obesity, unspecified: Secondary | ICD-10-CM | POA: Diagnosis not present

## 2020-07-03 DIAGNOSIS — E785 Hyperlipidemia, unspecified: Secondary | ICD-10-CM | POA: Diagnosis not present

## 2020-07-03 DIAGNOSIS — T84093D Other mechanical complication of internal left knee prosthesis, subsequent encounter: Secondary | ICD-10-CM | POA: Diagnosis not present

## 2020-07-03 DIAGNOSIS — Z87891 Personal history of nicotine dependence: Secondary | ICD-10-CM | POA: Diagnosis not present

## 2020-07-03 DIAGNOSIS — K219 Gastro-esophageal reflux disease without esophagitis: Secondary | ICD-10-CM | POA: Diagnosis not present

## 2020-07-03 DIAGNOSIS — Z7982 Long term (current) use of aspirin: Secondary | ICD-10-CM | POA: Diagnosis not present

## 2020-07-03 DIAGNOSIS — Z6828 Body mass index (BMI) 28.0-28.9, adult: Secondary | ICD-10-CM | POA: Diagnosis not present

## 2020-07-04 DIAGNOSIS — E785 Hyperlipidemia, unspecified: Secondary | ICD-10-CM | POA: Diagnosis not present

## 2020-07-04 DIAGNOSIS — Z6828 Body mass index (BMI) 28.0-28.9, adult: Secondary | ICD-10-CM | POA: Diagnosis not present

## 2020-07-04 DIAGNOSIS — I10 Essential (primary) hypertension: Secondary | ICD-10-CM | POA: Diagnosis not present

## 2020-07-04 DIAGNOSIS — K219 Gastro-esophageal reflux disease without esophagitis: Secondary | ICD-10-CM | POA: Diagnosis not present

## 2020-07-04 DIAGNOSIS — Z87891 Personal history of nicotine dependence: Secondary | ICD-10-CM | POA: Diagnosis not present

## 2020-07-04 DIAGNOSIS — E669 Obesity, unspecified: Secondary | ICD-10-CM | POA: Diagnosis not present

## 2020-07-04 DIAGNOSIS — Z7982 Long term (current) use of aspirin: Secondary | ICD-10-CM | POA: Diagnosis not present

## 2020-07-04 DIAGNOSIS — T84093D Other mechanical complication of internal left knee prosthesis, subsequent encounter: Secondary | ICD-10-CM | POA: Diagnosis not present

## 2020-07-05 DIAGNOSIS — R262 Difficulty in walking, not elsewhere classified: Secondary | ICD-10-CM | POA: Diagnosis not present

## 2020-07-05 DIAGNOSIS — Z96652 Presence of left artificial knee joint: Secondary | ICD-10-CM | POA: Diagnosis not present

## 2020-07-05 DIAGNOSIS — Z4789 Encounter for other orthopedic aftercare: Secondary | ICD-10-CM | POA: Diagnosis not present

## 2020-07-07 DIAGNOSIS — Z471 Aftercare following joint replacement surgery: Secondary | ICD-10-CM | POA: Diagnosis not present

## 2020-07-07 DIAGNOSIS — Z4789 Encounter for other orthopedic aftercare: Secondary | ICD-10-CM | POA: Diagnosis not present

## 2020-07-07 DIAGNOSIS — Z96652 Presence of left artificial knee joint: Secondary | ICD-10-CM | POA: Diagnosis not present

## 2020-07-07 DIAGNOSIS — R262 Difficulty in walking, not elsewhere classified: Secondary | ICD-10-CM | POA: Diagnosis not present

## 2020-07-12 DIAGNOSIS — Z4789 Encounter for other orthopedic aftercare: Secondary | ICD-10-CM | POA: Diagnosis not present

## 2020-07-12 DIAGNOSIS — R262 Difficulty in walking, not elsewhere classified: Secondary | ICD-10-CM | POA: Diagnosis not present

## 2020-07-12 DIAGNOSIS — Z96652 Presence of left artificial knee joint: Secondary | ICD-10-CM | POA: Diagnosis not present

## 2020-07-14 DIAGNOSIS — R262 Difficulty in walking, not elsewhere classified: Secondary | ICD-10-CM | POA: Diagnosis not present

## 2020-07-14 DIAGNOSIS — Z4789 Encounter for other orthopedic aftercare: Secondary | ICD-10-CM | POA: Diagnosis not present

## 2020-07-14 DIAGNOSIS — Z96652 Presence of left artificial knee joint: Secondary | ICD-10-CM | POA: Diagnosis not present

## 2020-07-17 DIAGNOSIS — R262 Difficulty in walking, not elsewhere classified: Secondary | ICD-10-CM | POA: Diagnosis not present

## 2020-07-17 DIAGNOSIS — Z4789 Encounter for other orthopedic aftercare: Secondary | ICD-10-CM | POA: Diagnosis not present

## 2020-07-17 DIAGNOSIS — Z96652 Presence of left artificial knee joint: Secondary | ICD-10-CM | POA: Diagnosis not present

## 2020-07-20 DIAGNOSIS — Z96652 Presence of left artificial knee joint: Secondary | ICD-10-CM | POA: Diagnosis not present

## 2020-07-20 DIAGNOSIS — Z4789 Encounter for other orthopedic aftercare: Secondary | ICD-10-CM | POA: Diagnosis not present

## 2020-07-20 DIAGNOSIS — R262 Difficulty in walking, not elsewhere classified: Secondary | ICD-10-CM | POA: Diagnosis not present

## 2020-07-24 DIAGNOSIS — Z96652 Presence of left artificial knee joint: Secondary | ICD-10-CM | POA: Diagnosis not present

## 2020-07-24 DIAGNOSIS — R262 Difficulty in walking, not elsewhere classified: Secondary | ICD-10-CM | POA: Diagnosis not present

## 2020-07-24 DIAGNOSIS — Z4789 Encounter for other orthopedic aftercare: Secondary | ICD-10-CM | POA: Diagnosis not present

## 2020-07-27 DIAGNOSIS — Z4789 Encounter for other orthopedic aftercare: Secondary | ICD-10-CM | POA: Diagnosis not present

## 2020-07-27 DIAGNOSIS — R262 Difficulty in walking, not elsewhere classified: Secondary | ICD-10-CM | POA: Diagnosis not present

## 2020-07-27 DIAGNOSIS — Z96652 Presence of left artificial knee joint: Secondary | ICD-10-CM | POA: Diagnosis not present

## 2020-07-31 ENCOUNTER — Other Ambulatory Visit: Payer: Self-pay | Admitting: Family Medicine

## 2020-07-31 DIAGNOSIS — Z4789 Encounter for other orthopedic aftercare: Secondary | ICD-10-CM | POA: Diagnosis not present

## 2020-07-31 DIAGNOSIS — R262 Difficulty in walking, not elsewhere classified: Secondary | ICD-10-CM | POA: Diagnosis not present

## 2020-07-31 DIAGNOSIS — Z96652 Presence of left artificial knee joint: Secondary | ICD-10-CM | POA: Diagnosis not present

## 2020-08-01 NOTE — Telephone Encounter (Addendum)
Requesting:lorazepam Contract:n/a UDS:n/a Last Visit:11/17/19 Next Visit: recommended CPE 10/2020 Last Refill:  Please Advise

## 2020-09-01 ENCOUNTER — Other Ambulatory Visit: Payer: Self-pay | Admitting: Family Medicine

## 2020-09-01 DIAGNOSIS — M65342 Trigger finger, left ring finger: Secondary | ICD-10-CM | POA: Diagnosis not present

## 2020-09-19 ENCOUNTER — Other Ambulatory Visit: Payer: Self-pay | Admitting: Family Medicine

## 2020-09-20 DIAGNOSIS — Z96652 Presence of left artificial knee joint: Secondary | ICD-10-CM | POA: Diagnosis not present

## 2020-10-18 DIAGNOSIS — Z96653 Presence of artificial knee joint, bilateral: Secondary | ICD-10-CM | POA: Diagnosis not present

## 2020-11-08 ENCOUNTER — Other Ambulatory Visit: Payer: Self-pay | Admitting: Family Medicine

## 2020-11-09 NOTE — Telephone Encounter (Signed)
Requesting: lorazepam Contract: n/a UDS: n/a Last Visit: 11/17/19 Next Visit: advised to f/u 1 year Last Refill: 08/01/20 (30,0)  Please Advise. Medications pending

## 2020-11-10 ENCOUNTER — Other Ambulatory Visit: Payer: Self-pay | Admitting: Family Medicine

## 2020-11-13 NOTE — Telephone Encounter (Signed)
LVM for pt to call back to schedule CPE appt

## 2020-11-13 NOTE — Telephone Encounter (Signed)
#  30 lorazepam eRx'd. Pls arrange annual CPE/RCI f/u appt in the next 1-2 months.-thx

## 2020-11-15 ENCOUNTER — Other Ambulatory Visit: Payer: Self-pay | Admitting: Family Medicine

## 2020-11-15 NOTE — Telephone Encounter (Signed)
LVM for pt to call back to schedule CPE appt.

## 2020-11-17 DIAGNOSIS — M65342 Trigger finger, left ring finger: Secondary | ICD-10-CM | POA: Diagnosis not present

## 2020-11-17 DIAGNOSIS — G5601 Carpal tunnel syndrome, right upper limb: Secondary | ICD-10-CM | POA: Diagnosis not present

## 2020-11-20 NOTE — Telephone Encounter (Signed)
LM for pt to return call to discuss.  

## 2020-11-29 ENCOUNTER — Other Ambulatory Visit: Payer: Self-pay | Admitting: Family Medicine

## 2020-12-19 ENCOUNTER — Other Ambulatory Visit: Payer: Self-pay

## 2020-12-20 ENCOUNTER — Encounter: Payer: Self-pay | Admitting: Family Medicine

## 2020-12-20 ENCOUNTER — Ambulatory Visit (INDEPENDENT_AMBULATORY_CARE_PROVIDER_SITE_OTHER): Payer: BC Managed Care – PPO | Admitting: Family Medicine

## 2020-12-20 VITALS — BP 109/71 | HR 76 | Temp 97.8°F | Resp 16 | Ht 70.0 in | Wt 197.0 lb

## 2020-12-20 DIAGNOSIS — I10 Essential (primary) hypertension: Secondary | ICD-10-CM

## 2020-12-20 DIAGNOSIS — R7303 Prediabetes: Secondary | ICD-10-CM | POA: Diagnosis not present

## 2020-12-20 DIAGNOSIS — Z0000 Encounter for general adult medical examination without abnormal findings: Secondary | ICD-10-CM

## 2020-12-20 DIAGNOSIS — E7800 Pure hypercholesterolemia, unspecified: Secondary | ICD-10-CM

## 2020-12-20 DIAGNOSIS — Z125 Encounter for screening for malignant neoplasm of prostate: Secondary | ICD-10-CM

## 2020-12-20 LAB — HEMOGLOBIN A1C: Hgb A1c MFr Bld: 5.6 % (ref 4.6–6.5)

## 2020-12-20 LAB — CBC WITH DIFFERENTIAL/PLATELET
Basophils Absolute: 0 10*3/uL (ref 0.0–0.1)
Basophils Relative: 0.4 % (ref 0.0–3.0)
Eosinophils Absolute: 0.2 10*3/uL (ref 0.0–0.7)
Eosinophils Relative: 3.6 % (ref 0.0–5.0)
HCT: 46.6 % (ref 39.0–52.0)
Hemoglobin: 15.6 g/dL (ref 13.0–17.0)
Lymphocytes Relative: 32.5 % (ref 12.0–46.0)
Lymphs Abs: 2.1 10*3/uL (ref 0.7–4.0)
MCHC: 33.5 g/dL (ref 30.0–36.0)
MCV: 90.3 fl (ref 78.0–100.0)
Monocytes Absolute: 0.6 10*3/uL (ref 0.1–1.0)
Monocytes Relative: 8.8 % (ref 3.0–12.0)
Neutro Abs: 3.5 10*3/uL (ref 1.4–7.7)
Neutrophils Relative %: 54.7 % (ref 43.0–77.0)
Platelets: 233 10*3/uL (ref 150.0–400.0)
RBC: 5.16 Mil/uL (ref 4.22–5.81)
RDW: 14.5 % (ref 11.5–15.5)
WBC: 6.4 10*3/uL (ref 4.0–10.5)

## 2020-12-20 LAB — COMPREHENSIVE METABOLIC PANEL
ALT: 22 U/L (ref 0–53)
AST: 17 U/L (ref 0–37)
Albumin: 4.4 g/dL (ref 3.5–5.2)
Alkaline Phosphatase: 73 U/L (ref 39–117)
BUN: 19 mg/dL (ref 6–23)
CO2: 28 mEq/L (ref 19–32)
Calcium: 9.8 mg/dL (ref 8.4–10.5)
Chloride: 102 mEq/L (ref 96–112)
Creatinine, Ser: 0.89 mg/dL (ref 0.40–1.50)
GFR: 92.72 mL/min (ref >60.00)
Glucose, Bld: 90 mg/dL (ref 70–99)
Potassium: 4.4 mEq/L (ref 3.5–5.1)
Sodium: 138 mEq/L (ref 135–145)
Total Bilirubin: 0.9 mg/dL (ref 0.2–1.2)
Total Protein: 6.7 g/dL (ref 6.0–8.3)

## 2020-12-20 LAB — LIPID PANEL
Cholesterol: 129 mg/dL (ref 0–200)
HDL: 47.6 mg/dL (ref >39.00)
LDL Cholesterol: 57 mg/dL (ref 0–99)
NonHDL: 81.52
Total CHOL/HDL Ratio: 3
Triglycerides: 125 mg/dL (ref 0.0–149.0)
VLDL: 25 mg/dL (ref 0.0–40.0)

## 2020-12-20 LAB — TSH: TSH: 1.8 u[IU]/mL (ref 0.35–4.50)

## 2020-12-20 LAB — PSA: PSA: 1.81 ng/mL (ref 0.10–4.00)

## 2020-12-20 MED ORDER — ATORVASTATIN CALCIUM 40 MG PO TABS: 1.0000 | ORAL_TABLET | Freq: Every day | ORAL | 3 refills | Status: DC

## 2020-12-20 NOTE — Progress Notes (Signed)
Office Note 12/20/2020  CC:  Chief Complaint  Patient presents with  . Annual Exam    Pt is fasting    HPI:  Billy Bates is a 60 y.o. White male who is here for annual health maintenance exam and f/u HTN, HLD, and insomnia. A/P as of last visit in April of 2021: "Preoperative clearance for TKA later this month. No indication for any pre-surg cardiopulmonary testing. Medical problems optimally controlled. He is cleared from cardiac and medical standpoint.  Labs wnl 2 mo ago.  Since he had L knee surgery since last labs I'll check CBC and BMET + PT/INR at this time.  OK to see in 1 yr for CPE. He'll call and make lab appt in near future when his employer requests biometrics.  Will add screening PSA to these labs (ordered).  Insomnia: lorazepam somewhat helpful.  OK to continue this med on prn basis. No RF needed today."  INTERIM HX: Knee surgery went fine, says the knees are fine.  Takes celebrex a few days a week. Has some mild LE/feet swelling and occ feeling of feet "falling asleep" after sitting for a while--resolves with walking.  Feeling well, eating healthy. Golfs 2-3 x/week--walks. Walks 3-4 miles a day. HTN: taking lisinoprol 10mg  qd.  Checking bps daily and seeing 120-130s syst, diast 80s-upper 90s. Some diastolics 50D.  HLD: tolerating atorva 40 qd  Insomnia: lorazepam 0.5mg , takes most nights and gets benefit w/out adverse effects.  PMP AWARE reviewed today: most recent rx for lorazepam 0.5mg  was filled 11/13/20, # 30, rx by me. No red flags.   Past Medical History:  Diagnosis Date  . Adenomatous colon polyp 12/2017   sessile serrated polyp at appendiceal orifice, not completely removed at endoscopy.  Pt had extended lap appendectomy to make sure entire polyp removed..  . Elevated PSA    Repeat 1 mo later was back down.  No bx.  No urology referral.  . Family history of prostate cancer    Father  . GERD (gastroesophageal reflux disease)   .  History of colitis    Lymphocytic colitis; episodic flares (most recent approx 2015)  . Hyperlipidemia    borderline--controlled with TLC.  2018 Framingham 10 yr CV risk = 8.1%: atorva started.  . Hypertension   . Insomnia   . Kidney laceration    Hx of, 1980  . Organic impotence   . Osteoarthritis, multiple sites    neck/knees.  L knee tricompartmental bone on bone.  R knee mild to moderate osteoarthritis (Dr. French Ana)  . Prediabetes    A1c consistently <6% until 03/2020->A1c 6.0%  . Splenic laceration    Hx of, 1980    Past Surgical History:  Procedure Laterality Date  . CERVICAL FUSION  approx 1999   C5-6  . COLONOSCOPY  12/2006; 01/2009; 12/2017   [facility] in [place], [place]--Dr. Ann Lions:  2008 (rectal bleeding)-Nonadenomatous polyp: rpt 10 yrs rec'd.  2010: RLQ pain and diarrhea--lymphocytic colitis.  Ileal polyp-benign, diverticulosis.  Recall 5 yrs per GI MD notes.  01/19/18 1 sess serr polyp at appendiceal orifice--incompletely removed.  GI referred him to gen surg for extended appendectomy.  Recall 12/2020.  [name] KNEE SURGERY     Ex baseball player.  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 LAPAROSCOPIC APPENDECTOMY N/A 03/23/2018   Procedure: LAPAROSCOPIC APPENDECTOMY;  Surgeon: Georganna Skeans, MD;  Location: [facility];  Service: General;  Laterality: N/A;  . [place]  . TOTAL  KNEE REVISION Left 06/27/2020   Procedure: LEFT TOTAL KNEE REVISION (POLY AND TIBIA);  Surgeon: Melrose Nakayama, MD;  Location: [facility];  Service: Orthopedics;  Laterality: Left;    Family History  Problem Relation Age of Onset  . Breast cancer Mother   . Heart disease Mother   . Arthritis Father   . Prostate cancer Father   . Stroke Father   . Hypertension Father   . Diabetes Neg Hx   . Colon cancer Neg Hx     Social History   Socioeconomic History  . Marital status: Married    Spouse name: Not on file  . Number of children: Not on file  . Years  of education: Not on file  . Highest education level: Not on file  Occupational History  . Not on file  Tobacco Use  . Smoking status: Never Smoker  . Smokeless tobacco: Former Systems developer    Types: Secondary school teacher  . Vaping Use: Never used  Substance and Sexual Activity  . Alcohol use: Yes    Comment: recreational   . Drug use: No  . Sexual activity: Not on file  Other Topics Concern  . Not on file  Social History Narrative   Married, 2 daughters.   Educ: BS   Occupation: Freight forwarder for [employer]      No [place] or drugs.  Occ alcohol.   Social Determinants of Health   Financial Resource Strain: Not on file  Food Insecurity: Not on file  Transportation Needs: Not on file  Physical Activity: Not on file  Stress: Not on file  Social Connections: Not on file  Intimate Partner Violence: Not on file    Outpatient Medications Prior to Visit  Medication Sig Dispense Refill  . aspirin EC 81 MG tablet Take 1 tablet (81 mg total) by mouth 2 (two) times daily after a meal. Swallow whole. 60 tablet 0  . atorvastatin (LIPITOR) 40 MG tablet TAKE 1 TABLET BY MOUTH EVERY DAY 30 tablet 1  . calcium carbonate (TUMS - DOSED IN MG ELEMENTAL CALCIUM) 500 MG chewable tablet Chew 2-3 tablets by mouth daily as needed for indigestion or heartburn.    . celecoxib (CELEBREX) 200 MG capsule TAKE 1 CAPSULE BY MOUTH ONCE A DAY AS NEEDED FOR PAIN WITH FOOD    . lisinopril (ZESTRIL) 10 MG tablet TAKE 1 TABLET BY MOUTH EVERY DAY 90 tablet 0  . LORazepam (ATIVAN) 0.5 MG tablet TAKE 1 TABLET BY MOUTH DAILY AS NEEDED. 30 tablet 0  . Multiple Vitamin (MULTIVITAMIN WITH MINERALS) TABS tablet Take 1 tablet by mouth daily.    [name] acetaminophen (TYLENOL) 500 MG tablet Take 1,500-2,000 mg by mouth daily as needed for moderate pain or headache. (Patient not taking: Reported on 12/20/2020)    . oxyCODONE-acetaminophen (PERCOCET) 5-325 MG tablet Take 1-2 tablets by mouth every 6 (six) hours as needed for moderate pain or severe pain  (post op pain). (Patient not taking: Reported on 12/20/2020) 40 tablet 0  . tiZANidine (ZANAFLEX) 4 MG tablet Take 1 tablet (4 mg total) by mouth every 6 (six) hours as needed for muscle spasms. (Patient not taking: Reported on 12/20/2020) 40 tablet 1   No facility-administered medications prior to visit.    No Known Allergies  ROS Review of Systems  Constitutional: Negative for appetite change, chills, fatigue and fever.  HENT: Negative for congestion, dental problem, ear pain and sore throat.   Eyes: Negative for discharge, redness and visual disturbance.  Respiratory:  Negative for cough, chest tightness, shortness of breath and wheezing.   Cardiovascular: Negative for chest pain, palpitations and leg swelling.  Gastrointestinal: Negative for abdominal pain, blood in stool, diarrhea, nausea and vomiting.  Genitourinary: Negative for difficulty urinating, dysuria, flank pain, frequency, hematuria and urgency.  Musculoskeletal: Negative for arthralgias, back pain, joint swelling, myalgias and neck stiffness.  Skin: Negative for pallor and rash.  Neurological: Negative for dizziness, speech difficulty, weakness and headaches.  Hematological: Negative for adenopathy. Does not bruise/bleed easily.  Psychiatric/Behavioral: Negative for confusion and sleep disturbance. The patient is not nervous/anxious.     PE; Vitals with BMI 12/20/2020 06/27/2020 06/27/2020  Height 5\' 10"  - -  Weight 197 lbs - -  BMI 79.02 - -  Systolic 409 735 329  Diastolic 71 90 92  Pulse 76 66 66   Gen: Alert, well appearing.  Patient is oriented to person, place, time, and situation. AFFECT: pleasant, lucid thought and speech. ENT: Ears: EACs clear, normal epithelium.  TMs with good light reflex and landmarks bilaterally.  Eyes: no injection, icteris, swelling, or exudate.  EOMI, PERRLA. Nose: no drainage or turbinate edema/swelling.  No injection or focal lesion.  Mouth: lips without lesion/swelling.  Oral mucosa  pink and moist.  Dentition intact and without obvious caries or gingival swelling.  Oropharynx without erythema, exudate, or swelling.  Neck: supple/nontender.  No LAD, mass, or TM.  Carotid pulses 2+ bilaterally, without bruits. CV: RRR, no m/r/g.   LUNGS: CTA bilat, nonlabored resps, good aeration in all lung fields. ABD: soft, NT, ND, BS normal.  No hepatospenomegaly or mass.  No bruits. EXT: no clubbing, cyanosis, or edema.  Musculoskeletal: no joint swelling, erythema, warmth, or tenderness.  ROM of all joints intact. Skin - no sores or suspicious lesions or rashes or color changes    Pertinent labs:  Lab Results  Component Value Date   TSH 1.54 02/23/2019   Lab Results  Component Value Date   WBC 7.1 06/14/2020   HGB 15.9 06/14/2020   HCT 48.1 06/14/2020   MCV 89.6 06/14/2020   PLT 248 06/14/2020   Lab Results  Component Value Date   CREATININE 0.81 06/14/2020   BUN 15 06/14/2020   NA 138 06/14/2020   K 4.5 06/14/2020   CL 102 06/14/2020   CO2 32 06/14/2020   Lab Results  Component Value Date   ALT 23 11/17/2019   AST 19 11/17/2019   ALKPHOS 73 11/17/2019   BILITOT 0.6 11/17/2019   Lab Results  Component Value Date   CHOL 153 02/23/2019   Lab Results  Component Value Date   HDL 53.40 02/23/2019   Lab Results  Component Value Date   LDLCALC 70 02/23/2019   Lab Results  Component Value Date   TRIG 146.0 02/23/2019   Lab Results  Component Value Date   CHOLHDL 3 02/23/2019   Lab Results  Component Value Date   PSA 2.72 02/23/2019   PSA 2.27 05/09/2017   PSA 2.69 03/25/2016   Lab Results  Component Value Date   HGBA1C 6.0 04/25/2020   ASSESSMENT AND PLAN:   1) HTN: good control. Cont lisinopril 10mg  qd. Lytes/cr today.  2) HLD: tolerating atorva 40mg  qd. Great diet and exercise habits. FLP and hepatic panel today.  3) Insomnia: using loraz 0.5mg  qhs prn. Controlled substance contract reviewed with patient today.  Patient signed this  and it will be placed in the chart.    4) Prediabetes: great diet and exercise habits.  Hba1c and fasting glucose today.  5) Health maintenance exam: Reviewed age and gender appropriate health maintenance issues (prudent diet, regular exercise, health risks of tobacco and excessive alcohol, use of seatbelts, fire alarms in home, use of sunscreen).  Also reviewed age and gender appropriate health screening as well as vaccine recommendations. Vaccines: ALL UTD. Labs: fasting HP, Hba1c, and PSA. Prostate ca screening: PSA ordered. Colon ca screening: recall 12/2020  An After Visit Summary was printed and given to the patient.  FOLLOW UP:  No follow-ups on file.  Signed:  Crissie Sickles, MD           12/20/2020

## 2020-12-20 NOTE — Patient Instructions (Signed)

## 2021-02-05 ENCOUNTER — Other Ambulatory Visit: Payer: Self-pay | Admitting: Family Medicine

## 2021-02-05 DIAGNOSIS — G5601 Carpal tunnel syndrome, right upper limb: Secondary | ICD-10-CM | POA: Diagnosis not present

## 2021-02-05 DIAGNOSIS — G5602 Carpal tunnel syndrome, left upper limb: Secondary | ICD-10-CM | POA: Diagnosis not present

## 2021-02-05 DIAGNOSIS — M25562 Pain in left knee: Secondary | ICD-10-CM | POA: Diagnosis not present

## 2021-02-05 DIAGNOSIS — M4692 Unspecified inflammatory spondylopathy, cervical region: Secondary | ICD-10-CM | POA: Diagnosis not present

## 2021-02-05 DIAGNOSIS — M25561 Pain in right knee: Secondary | ICD-10-CM | POA: Diagnosis not present

## 2021-02-06 NOTE — Telephone Encounter (Signed)
Requesting: Lorazepam Contract: 12/20/20 UDS: n/a Last Visit: 12/20/20 Next Visit: advised to f/u 6 mo Last Refill:11/13/20(30,0)  Please Advise. Medication pending

## 2021-02-07 DIAGNOSIS — M542 Cervicalgia: Secondary | ICD-10-CM | POA: Diagnosis not present

## 2021-02-19 DIAGNOSIS — M5412 Radiculopathy, cervical region: Secondary | ICD-10-CM | POA: Diagnosis not present

## 2021-02-20 DIAGNOSIS — M5412 Radiculopathy, cervical region: Secondary | ICD-10-CM | POA: Diagnosis not present

## 2021-02-23 ENCOUNTER — Other Ambulatory Visit: Payer: Self-pay | Admitting: Family Medicine

## 2021-02-26 IMAGING — DX DG CHEST 2V
2 series · 2 of 2 positions shown · non-contrast
Comparison: None.

CLINICAL DATA: Preoperative assessment for knee revision

EXAM:
CHEST - 2 VIEW

[chest pa]
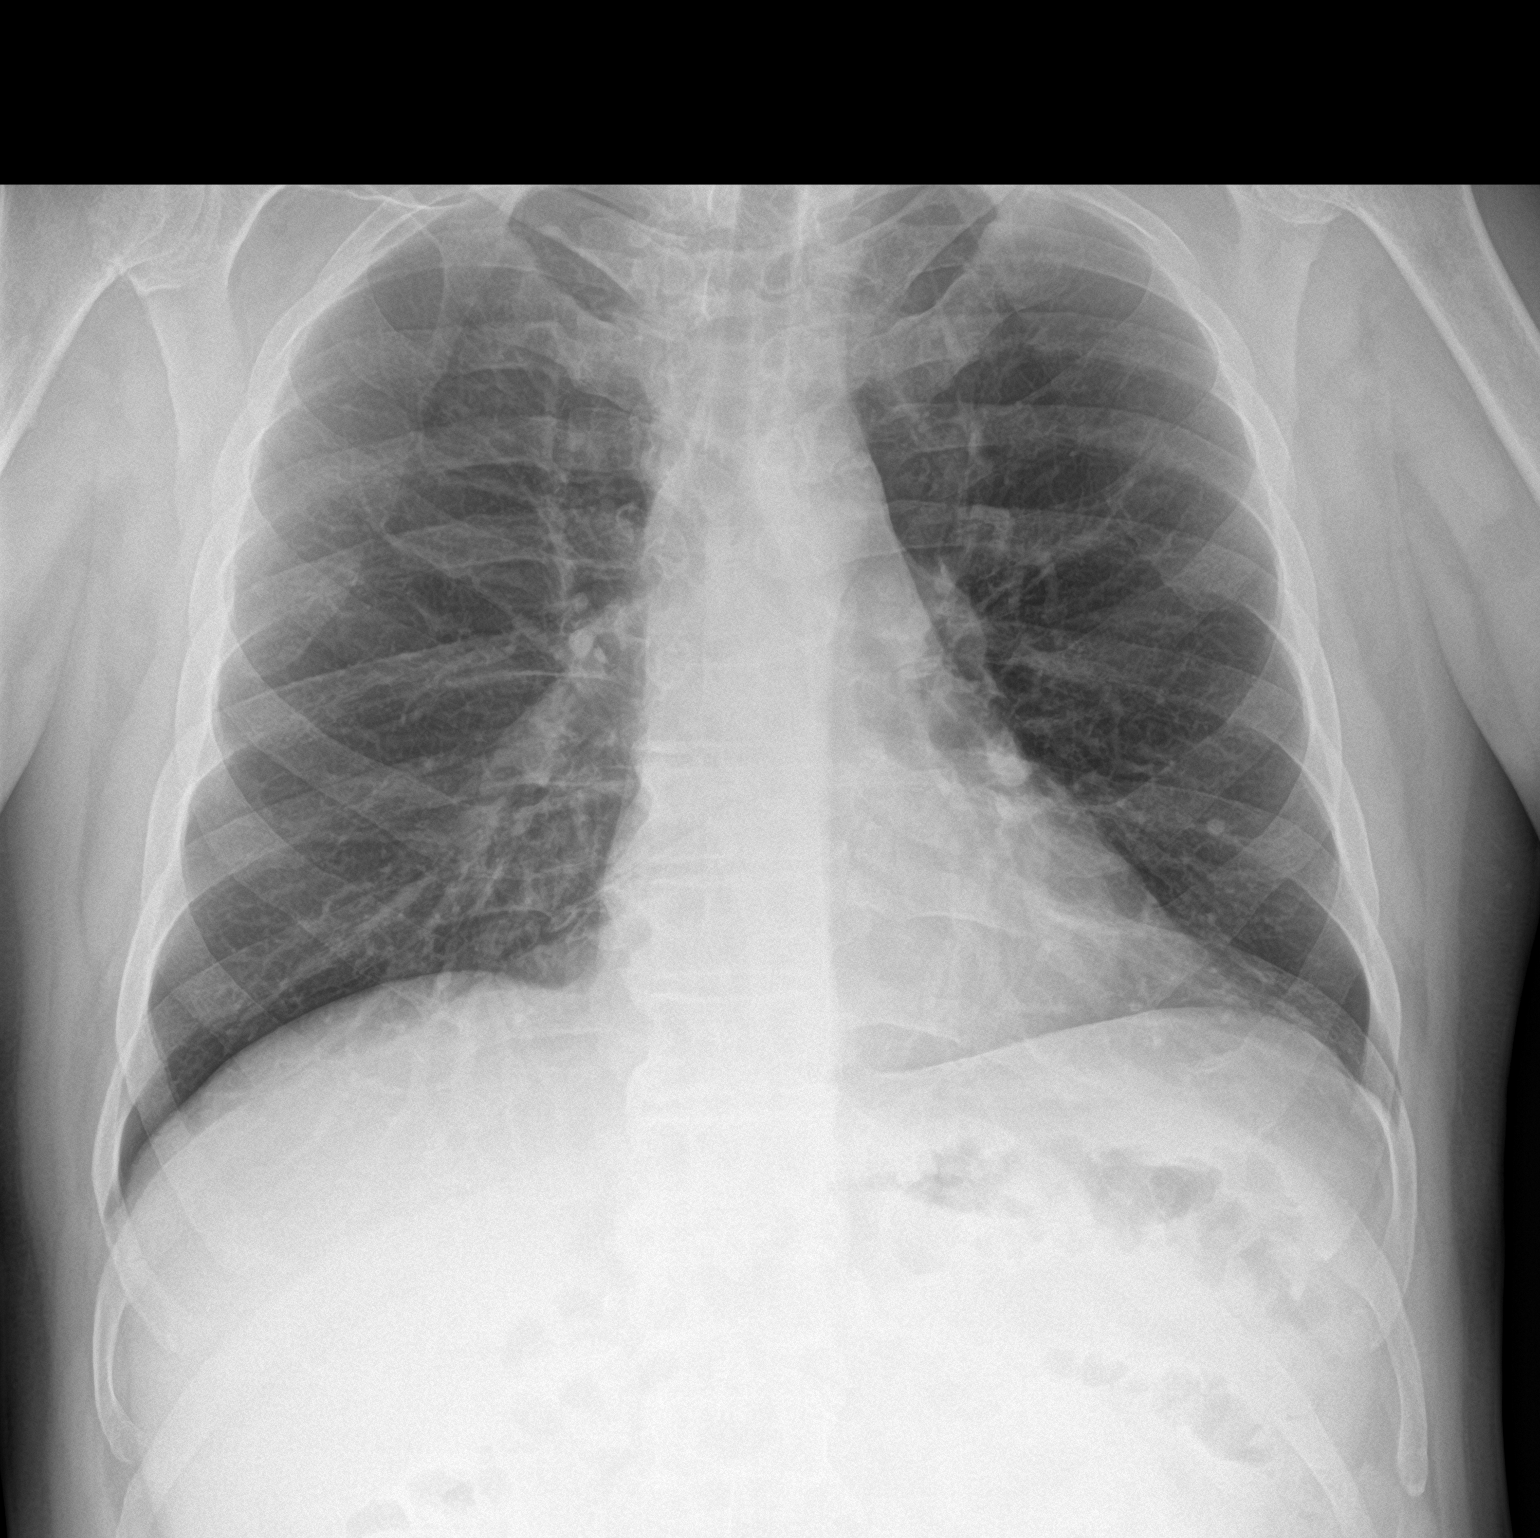

[chest lat]
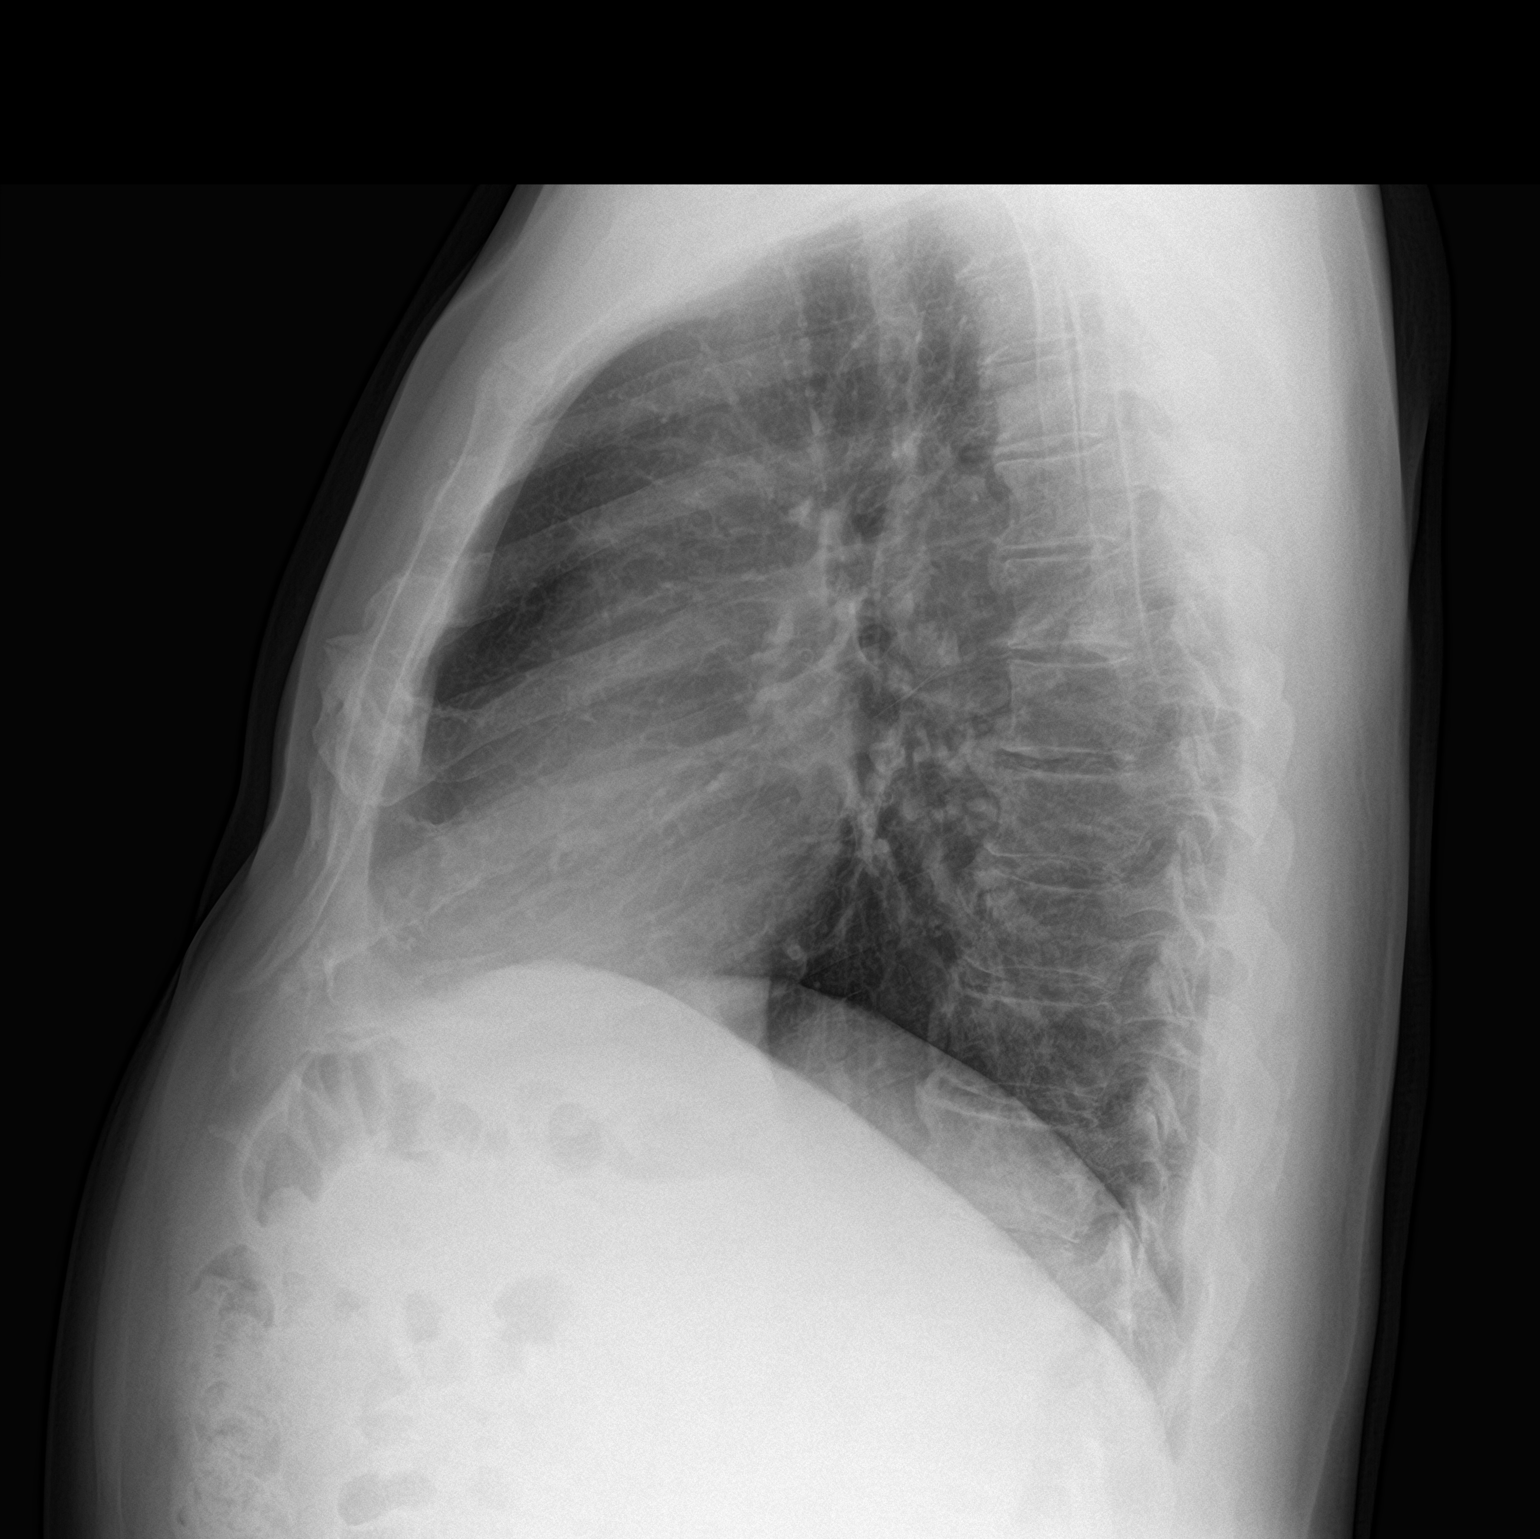

[2 of 2 positions shown; findings below may reference images not displayed]

FINDINGS: Frontal and lateral views of the chest demonstrate an unremarkable
cardiac silhouette. No airspace disease, effusion, or pneumothorax.
No acute bony abnormalities.
IMPRESSION: 1. No acute intrathoracic process.

## 2021-03-01 DIAGNOSIS — M65341 Trigger finger, right ring finger: Secondary | ICD-10-CM | POA: Diagnosis not present

## 2021-03-01 DIAGNOSIS — G5602 Carpal tunnel syndrome, left upper limb: Secondary | ICD-10-CM | POA: Diagnosis not present

## 2021-03-01 DIAGNOSIS — M65342 Trigger finger, left ring finger: Secondary | ICD-10-CM | POA: Diagnosis not present

## 2021-04-02 ENCOUNTER — Encounter: Payer: Self-pay | Admitting: Internal Medicine

## 2021-05-25 ENCOUNTER — Other Ambulatory Visit: Payer: Self-pay | Admitting: Family Medicine

## 2021-05-29 DIAGNOSIS — M65342 Trigger finger, left ring finger: Secondary | ICD-10-CM | POA: Diagnosis not present

## 2021-05-29 DIAGNOSIS — G5602 Carpal tunnel syndrome, left upper limb: Secondary | ICD-10-CM | POA: Diagnosis not present

## 2021-05-29 DIAGNOSIS — M65351 Trigger finger, right little finger: Secondary | ICD-10-CM | POA: Diagnosis not present

## 2021-05-29 DIAGNOSIS — M65341 Trigger finger, right ring finger: Secondary | ICD-10-CM | POA: Diagnosis not present

## 2021-06-04 ENCOUNTER — Telehealth: Payer: Self-pay | Admitting: *Deleted

## 2021-06-04 ENCOUNTER — Encounter: Payer: Self-pay | Admitting: Internal Medicine

## 2021-06-04 NOTE — Telephone Encounter (Signed)
Dr Hilarie Fredrickson,  This pt just scheduled a colonoscopy for 12-19- he has a hx of polyps-  last colon 01-19-18  He  is scheduled for a total knee revision 06-27-2021, then his colon 2 weeks and 5 days later 07-16-2021  Should he post pone his colon since it's this soon after the total knee?  Please advise, Thanks for your time,Marie PV

## 2021-06-07 NOTE — Telephone Encounter (Signed)
Spoke with the patient. He had knee surgery 05-2020. He is going to have carpel tunnel surgery next week but he feels he would be fine to proceed with the procedure as scheduled.

## 2021-06-07 NOTE — Telephone Encounter (Signed)
Called patient, no answer, left a message for the patient to call us back.

## 2021-06-07 NOTE — Telephone Encounter (Signed)
Concern would be 2 things: Will he be on any anticoagulation or DVT prophylaxis after surgery and for how long? Also will his mobility be limited given the frequent bathroom trips and up and down needed to prep for colonoscopy?  For this reason would be reasonable to delay the colonoscopy until 4 to 6 weeks after surgery assuming he is okay with this and understands my reason for doing so. If he feels that it will not be a problem for him I am okay if he keeps colonoscopy as scheduled

## 2021-06-15 DIAGNOSIS — M65352 Trigger finger, left little finger: Secondary | ICD-10-CM | POA: Diagnosis not present

## 2021-06-15 DIAGNOSIS — M65342 Trigger finger, left ring finger: Secondary | ICD-10-CM | POA: Diagnosis not present

## 2021-06-15 DIAGNOSIS — G5602 Carpal tunnel syndrome, left upper limb: Secondary | ICD-10-CM | POA: Diagnosis not present

## 2021-06-18 ENCOUNTER — Other Ambulatory Visit: Payer: Self-pay

## 2021-06-18 ENCOUNTER — Ambulatory Visit (AMBULATORY_SURGERY_CENTER): Payer: BC Managed Care – PPO | Admitting: *Deleted

## 2021-06-18 ENCOUNTER — Encounter: Payer: Self-pay | Admitting: Internal Medicine

## 2021-06-18 VITALS — Ht 71.0 in | Wt 195.0 lb

## 2021-06-18 DIAGNOSIS — Z86010 Personal history of colonic polyps: Secondary | ICD-10-CM

## 2021-06-18 MED ORDER — SUTAB 1479-225-188 MG PO TABS: 24.0000 | ORAL_TABLET | ORAL | 0 refills | Status: DC

## 2021-06-18 NOTE — Progress Notes (Signed)
No egg or soy allergy known to patient  No issues known to pt with past sedation with any surgeries or procedures Patient denies ever being told they had issues or difficulty with intubation  No FH of Malignant Hyperthermia Pt is not on diet pills Pt is not on  home 02  Pt is not on blood thinners  Pt denies issues with constipation  No A fib or A flutter  Pt is fully vaccinated  for Covid   NO PA's for preps discussed with pt In PV today  Discussed with pt there will be an out-of-pocket cost for prep and that varies from $0 to 70 +  dollars - pt verbalized understanding   Due to the COVID-19 pandemic we are asking patients to follow certain guidelines in PV and the [facility]   Pt aware of COVID protocols and [facility] guidelines   PV completed over the phone. Pt verified name, DOB, address and insurance during PV today.  Pt mailed instruction packet with copy of consent form to read and not return, and instructions.  S[place] coupon to pt in packet  Pt encouraged to call with questions or issues.  If pt has My chart, procedure instructions sent via My Chart

## 2021-06-23 ENCOUNTER — Other Ambulatory Visit: Payer: Self-pay | Admitting: Family Medicine

## 2021-06-26 ENCOUNTER — Encounter: Payer: Self-pay | Admitting: Family Medicine

## 2021-07-13 ENCOUNTER — Telehealth: Payer: Self-pay

## 2021-07-13 NOTE — Telephone Encounter (Signed)
Spoke with pt and explained follow up recommended for blood pressure, cholesterol and prediabetes to see if any changes needed with medication or labs that may note any medication changes needed. Pt voiced understanding but still feels like he does not need to come in this soon and pay $250 for an appointment. He would prefer to receive refills and come in for next physical in May. If there will be issues with receiving refills, he will find another PCP. He apologized for frustration possibly seeming towards me but would just like to receive refill.   Please fill, if appropriate.

## 2021-07-13 NOTE — Telephone Encounter (Signed)
Rec'd voicemail from patient - in regards to his request for refill on Lisinopril. Patient states he has been getting messages that he needs an office visit.  Patient said that he didn't need to come in office for a "measly" refill.  "Someone needs to call and explain why I need to come in for this."  Patient can be reached at [phone].

## 2021-07-14 MED ORDER — LISINOPRIL 10 MG PO TABS: 10.0000 mg | ORAL_TABLET | Freq: Every day | ORAL | 3 refills | Status: DC

## 2021-07-14 NOTE — Telephone Encounter (Signed)
OK to f/u for cpe in may. I sent in lisinopril rx. -thx

## 2021-07-16 ENCOUNTER — Encounter: Payer: Self-pay | Admitting: Internal Medicine

## 2021-07-16 ENCOUNTER — Ambulatory Visit (AMBULATORY_SURGERY_CENTER): Payer: BC Managed Care – PPO | Admitting: Internal Medicine

## 2021-07-16 ENCOUNTER — Other Ambulatory Visit: Payer: Self-pay

## 2021-07-16 VITALS — BP 126/77 | HR 71 | Temp 98.4°F | Resp 20 | Ht 70.0 in

## 2021-07-16 DIAGNOSIS — Z86010 Personal history of colonic polyps: Secondary | ICD-10-CM

## 2021-07-16 DIAGNOSIS — D124 Benign neoplasm of descending colon: Secondary | ICD-10-CM

## 2021-07-16 DIAGNOSIS — K635 Polyp of colon: Secondary | ICD-10-CM

## 2021-07-16 MED ORDER — SODIUM CHLORIDE 0.9 % IV SOLN: 500.0000 mL | Freq: Once | INTRAVENOUS | Status: DC

## 2021-07-16 NOTE — Progress Notes (Signed)
GASTROENTEROLOGY PROCEDURE H&P NOTE   Primary Care Physician: Tammi Sou, MD    Reason for Procedure:  Surveillance of sessile serrated colon polyp  Plan:    Colonoscopy  Patient is appropriate for endoscopic procedure(s) in the ambulatory ([facility]) setting.  The nature of the procedure, as well as the risks, benefits, and alternatives were carefully and thoroughly reviewed with the patient. Ample time for discussion and questions allowed. The patient understood, was satisfied, and agreed to proceed.     HPI: Billy Bates is a 60 y.o. male who presents for surveillance colonoscopy.  Last exam June 2019.  SSP involving the appendiceal orifice which was incompletely removed and he is status post laparoscopic appendectomy 2 months after last colonoscopy.  Tolerated the prep.  No recent chest pain or shortness of breath.  No abdominal pain today.  Past Medical History:  Diagnosis Date   Adenomatous colon polyp 12/2017   sessile serrated polyp at appendiceal orifice, not completely removed at endoscopy.  Pt had extended lap appendectomy to make sure entire polyp removed..   Allergy    Carpal tunnel syndrome on both sides    Elevated PSA    Repeat 1 mo later was back down.  No bx.  No urology referral.   Family history of prostate cancer    Father   GERD (gastroesophageal reflux disease)    History of colitis    Lymphocytic colitis; episodic flares (most recent approx 2015)   Hyperlipidemia    borderline--controlled with TLC.  2018 [place] 10 yr CV risk = 8.1%: atorva started.   Hypertension    Insomnia    Kidney laceration    Hx of, 1980   Organic impotence    Osteoarthritis, multiple sites    neck/knees.  L knee tricompartmental bone on bone.  R knee mild to moderate osteoarthritis (Dr. French Ana)   Prediabetes    A1c consistently <6% until 03/2020->A1c 6.0%   Splenic laceration    Hx of, 1980    Past Surgical History:  Procedure Laterality Date    BILATERAL CARPAL TUNNEL RELEASE Bilateral    CERVICAL FUSION  approx 1999   C5-6   COLONOSCOPY  12/2006; 01/2009; 12/2017   [facility] in [place], [place]--Dr. Ann Lions:  2008 (rectal bleeding)-Nonadenomatous polyp: rpt 10 yrs rec'd.  2010: RLQ pain and diarrhea--lymphocytic colitis.  Ileal polyp-benign, diverticulosis.  Recall 5 yrs per GI MD notes.  01/19/18 1 sess serr polyp at appendiceal orifice--incompletely removed.  GI referred him to gen surg for extended appendectomy.  Recall 12/2020.   KNEE SURGERY     Ex baseball player.  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 Authorizing Provider  atorvastatin (LIPITOR) 40 MG tablet Take 1 tablet (40 mg total) by mouth daily. 12/20/20  Yes McGowen, Adrian Blackwater, MD  celecoxib (CELEBREX) 200 MG capsule TAKE 1 CAPSULE BY MOUTH ONCE A DAY AS NEEDED FOR PAIN WITH FOOD 10/28/20  Yes [provider]  lisinopril (ZESTRIL) 10 MG tablet Take 1 tablet (10 mg total) by mouth daily. 07/14/21  Yes McGowen, Adrian Blackwater, MD  calcium carbonate (TUMS - DOSED IN MG ELEMENTAL CALCIUM) 500 MG chewable tablet Chew 2-3 tablets by mouth daily as needed for indigestion or heartburn.    [provider]  gabapentin (NEURONTIN) 300 MG capsule Take 300 mg by mouth at bedtime. 07/12/21   [provider]  LORazepam (ATIVAN) 0.5 MG tablet TAKE 1 TABLET BY MOUTH EVERY DAY AS NEEDED 02/06/21   McGowen, Adrian Blackwater, MD  Multiple Vitamin  (MULTIVITAMIN WITH MINERALS) TABS tablet Take 1 tablet by mouth daily.    [provider]  PENNSAID 2 % SOLN SMARTSIG:1 Pump Topical Twice Daily PRN Patient not taking: Reported on 06/18/2021 12/29/20   [provider]    Current Outpatient Medications  Medication Sig Dispense Refill   atorvastatin (LIPITOR) 40 MG tablet Take 1 tablet (40 mg total) by mouth daily. 90 tablet 3   celecoxib (CELEBREX) 200 MG capsule TAKE 1 CAPSULE BY MOUTH ONCE A DAY AS NEEDED FOR PAIN WITH FOOD     lisinopril (ZESTRIL) 10 MG tablet Take 1 tablet (10 mg total) by mouth daily. 90 tablet 3   calcium carbonate (TUMS - DOSED IN MG ELEMENTAL CALCIUM) 500 MG chewable tablet Chew 2-3 tablets by mouth daily as needed for indigestion or heartburn.     gabapentin (NEURONTIN) 300 MG capsule Take 300 mg by mouth at bedtime.     LORazepam (ATIVAN) 0.5 MG tablet TAKE 1 TABLET BY MOUTH EVERY DAY AS NEEDED 30 tablet 1   Multiple Vitamin (MULTIVITAMIN WITH MINERALS) TABS tablet Take 1 tablet by mouth daily.     PENNSAID 2 % SOLN SMARTSIG:1 Pump Topical Twice Daily PRN (Patient not taking: Reported on 06/18/2021)     Current Facility-Administered Medications  Medication Dose Route Frequency Provider Last Rate Last Admin   0.9 %  sodium chloride infusion  500 mL Intravenous Once Malaina Mortellaro, Lajuan Lines, MD        Allergies as of 07/16/2021   (No Known Allergies)    Family History  Problem Relation Age of Onset   Breast cancer Mother    Heart disease Mother    Colon polyps Father    Arthritis Father    Prostate cancer Father    Stroke Father    Hypertension Father    Colon cancer Neg Hx    Rectal cancer Neg Hx    Stomach cancer Neg Hx    Esophageal cancer Neg Hx     Social History   Socioeconomic History   Marital status: Married    Spouse name: Not on file   Number of children: Not on file   Years of education: Not on file   Highest education level: Not on file  Occupational History   Not on file   Tobacco Use   Smoking status: Never   Smokeless tobacco: Former    Types: Nurse, children's Use: Never used  Substance and Sexual Activity   Alcohol use: Yes    Comment: recreational    Drug use: No   Sexual activity: Not on file  Other Topics Concern   Not on file  Social History Narrative   Married, 2 daughters.   Educ: BS   Occupation: Freight forwarder for [employer]      No [place] or drugs.  Occ alcohol.   Social Determinants of Health   Financial Resource Strain:  Not on file  Food Insecurity: Not on file  Transportation Needs: Not on file  Physical Activity: Not on file  Stress: Not on file  Social Connections: Not on file  Intimate Partner Violence: Not on file    Physical Exam: Vital signs in last 24 hours: @BP  (!) 121/91    Pulse 82    Temp 98.4 F (36.9 C)    Resp 17    Ht 5\' 10"  (1.778 m)    SpO2 98%    BMI 27.98 kg/m  GEN: NAD EYE: Sclerae anicteric ENT: MMM CV: Non-tachycardic Pulm: CTA b/l GI: Soft, NT/ND NEURO:  Alert & Oriented x 3   Zenovia Jarred, MD [facility]  07/16/2021 2:16 PM

## 2021-07-16 NOTE — Progress Notes (Signed)
Report to PACU, RN, vss, BBS= Clear.  

## 2021-07-16 NOTE — Progress Notes (Signed)
[place] vital signs. 

## 2021-07-16 NOTE — Progress Notes (Signed)
Called to room to assist during endoscopic procedure.  Patient ID and intended procedure confirmed with present staff. Received instructions for my participation in the procedure from the performing physician.  

## 2021-07-16 NOTE — Op Note (Signed)
[place] Patient Name: Billy Bates Procedure Date: 07/16/2021 2:12 PM MRN: [id number] Endoscopist: Jerene Bears , MD Age: 60 Referring MD:  Date of Birth: [birth year] Gender: Male Account #: [id number] Procedure:                Colonoscopy Indications:              High risk colon cancer surveillance: Personal                            history of sessile serrated colon polyp (less than                            10 mm in size) with no dysplasia involving the                            appendiceal orifice (appendectomy performed after                            last colonoscopy), Last colonoscopy: June 2019 Medicines:                Monitored Anesthesia Care Procedure:                Pre-Anesthesia Assessment:                           - Prior to the procedure, a History and Physical                            was performed, and patient medications and                            allergies were reviewed. The patient's tolerance of                            previous anesthesia was also reviewed. The risks                            and benefits of the procedure and the sedation                            options and risks were discussed with the patient.                            All questions were answered, and informed consent                            was obtained. Prior Anticoagulants: The patient has                            taken no previous anticoagulant or antiplatelet                            agents. ASA Grade Assessment: II - A patient with  mild systemic disease. After reviewing the risks                            and benefits, the patient was deemed in                            satisfactory condition to undergo the procedure.                           After obtaining informed consent, the colonoscope                            was passed under direct vision. Throughout the                            procedure, the patient's  blood pressure, pulse, and                            oxygen saturations were monitored continuously. The                            Olympus CF-HQ190L [phone]) Colonoscope was                            introduced through the anus and advanced to the                            cecum, identified by appendiceal orifice and                            ileocecal valve. The colonoscopy was performed                            without difficulty. The patient tolerated the                            procedure well. The quality of the bowel                            preparation was good with 2 day prep (MiraLax +                            Sutab). The ileocecal valve, appendiceal orifice,                            and rectum were photographed. Scope In: 2:23:41 PM Scope Out: 2:35:43 PM Scope Withdrawal Time: 0 hours 10 minutes 46 seconds  Total Procedure Duration: 0 hours 12 minutes 2 seconds  Findings:                 The digital rectal exam was normal.                           A 4 mm polyp was found in the descending colon. The  polyp was sessile. The polyp was removed with a                            cold snare. Resection and retrieval were complete.                           Multiple small and large-mouthed diverticula were                            found in the sigmoid colon and descending colon.                           Internal hemorrhoids were found during                            retroflexion. The hemorrhoids were small. Complications:            No immediate complications. Estimated Blood Loss:     Estimated blood loss was minimal. Impression:               - One 4 mm polyp in the descending colon, removed                            with a cold snare. Resected and retrieved.                           - Diverticulosis in the sigmoid colon and in the                            descending colon.                           - Internal  hemorrhoids. Recommendation:           - Patient has a contact number available for                            emergencies. The signs and symptoms of potential                            delayed complications were discussed with the                            patient. Return to normal activities tomorrow.                            Written discharge instructions were provided to the                            patient.                           - Resume previous diet.                           - Continue present medications.                           -  Await pathology results.                           - Repeat colonoscopy is recommended for                            surveillance in 5 years. The colonoscopy date will                            be determined after pathology results from today's                            exam become available for review. Jerene Bears, MD 07/16/2021 2:38:39 PM This report has been signed electronically.

## 2021-07-16 NOTE — Telephone Encounter (Signed)
LM for pt regarding med refill °

## 2021-07-16 NOTE — Progress Notes (Signed)
Pt's states no medical or surgical changes since previsit or office visit. 

## 2021-07-16 NOTE — Patient Instructions (Signed)
Please read handouts provided. Continue present medications. Await pathology results. Resume previous diet.   YOU HAD AN ENDOSCOPIC PROCEDURE TODAY AT [facility]:   Refer to the procedure report that was given to you for any specific questions about what was found during the examination.  If the procedure report does not answer your questions, please call your gastroenterologist to clarify.  If you requested that your care partner not be given the details of your procedure findings, then the procedure report has been included in a sealed envelope for you to review at your convenience later.  YOU SHOULD EXPECT: Some feelings of bloating in the abdomen. Passage of more gas than usual.  Walking can help get rid of the air that was put into your GI tract during the procedure and reduce the bloating. If you had a lower endoscopy (such as a colonoscopy or flexible sigmoidoscopy) you may notice spotting of blood in your stool or on the toilet paper. If you underwent a bowel prep for your procedure, you may not have a normal bowel movement for a few days.  Please Note:  You might notice some irritation and congestion in your nose or some drainage.  This is from the oxygen used during your procedure.  There is no need for concern and it should clear up in a day or so.  SYMPTOMS TO REPORT IMMEDIATELY:  Following lower endoscopy (colonoscopy or flexible sigmoidoscopy):  Excessive amounts of blood in the stool  Significant tenderness or worsening of abdominal pains  Swelling of the abdomen that is new, acute  Fever of 100F or higher   For urgent or emergent issues, a gastroenterologist can be reached at any hour by calling [phone]. Do not use MyChart messaging for urgent concerns.    DIET:  We do recommend a small meal at first, but then you may proceed to your regular diet.  Drink plenty of fluids but you should avoid alcoholic beverages for 24 hours.  ACTIVITY:  You should  plan to take it easy for the rest of today and you should NOT DRIVE or use heavy machinery until tomorrow (because of the sedation medicines used during the test).    FOLLOW UP: Our staff will call the number listed on your records 48-72 hours following your procedure to check on you and address any questions or concerns that you may have regarding the information given to you following your procedure. If we do not reach you, we will leave a message.  We will attempt to reach you two times.  During this call, we will ask if you have developed any symptoms of COVID 19. If you develop any symptoms (ie: fever, flu-like symptoms, shortness of breath, cough etc.) before then, please call [phone].  If you test positive for Covid 19 in the 2 weeks post procedure, please call and report this information to [place].    If any biopsies were taken you will be contacted by phone or by letter within the next 1-3 weeks.  Please call us at [phone] if you have not heard about the biopsies in 3 weeks.    SIGNATURES/CONFIDENTIALITY: You and/or your care partner have signed paperwork which will be entered into your electronic medical record.  These signatures attest to the fact that that the information above on your After Visit Summary has been reviewed and is understood.  Full responsibility of the confidentiality of this discharge information lies with you and/or your care-partner.

## 2021-07-17 NOTE — Telephone Encounter (Signed)
Left detailed message advising refill sent and okay to f/u May for CPE, okay per [facility]

## 2021-07-18 ENCOUNTER — Telehealth: Payer: Self-pay | Admitting: *Deleted

## 2021-07-18 ENCOUNTER — Telehealth: Payer: Self-pay

## 2021-07-18 NOTE — Telephone Encounter (Signed)
Attempted 2nd f/u phone call. No answer. Left message.  °

## 2021-07-18 NOTE — Telephone Encounter (Signed)
No answer, left message to call back later today, B.Presly Steinruck RN. 

## 2021-07-31 ENCOUNTER — Encounter: Payer: Self-pay | Admitting: Internal Medicine

## 2021-10-29 DIAGNOSIS — M25561 Pain in right knee: Secondary | ICD-10-CM | POA: Diagnosis not present

## 2021-10-29 DIAGNOSIS — M545 Low back pain, unspecified: Secondary | ICD-10-CM | POA: Diagnosis not present

## 2021-10-29 DIAGNOSIS — M25552 Pain in left hip: Secondary | ICD-10-CM | POA: Diagnosis not present

## 2021-10-29 DIAGNOSIS — M25562 Pain in left knee: Secondary | ICD-10-CM | POA: Diagnosis not present

## 2021-11-08 DIAGNOSIS — G5602 Carpal tunnel syndrome, left upper limb: Secondary | ICD-10-CM | POA: Diagnosis not present

## 2021-11-08 DIAGNOSIS — M65352 Trigger finger, left little finger: Secondary | ICD-10-CM | POA: Diagnosis not present

## 2021-11-08 DIAGNOSIS — M65351 Trigger finger, right little finger: Secondary | ICD-10-CM | POA: Diagnosis not present

## 2021-11-08 DIAGNOSIS — M65342 Trigger finger, left ring finger: Secondary | ICD-10-CM | POA: Diagnosis not present

## 2021-11-29 DIAGNOSIS — M5136 Other intervertebral disc degeneration, lumbar region: Secondary | ICD-10-CM | POA: Diagnosis not present

## 2021-11-29 DIAGNOSIS — M1991 Primary osteoarthritis, unspecified site: Secondary | ICD-10-CM | POA: Diagnosis not present

## 2021-11-29 DIAGNOSIS — M7989 Other specified soft tissue disorders: Secondary | ICD-10-CM | POA: Diagnosis not present

## 2021-11-29 DIAGNOSIS — M064 Inflammatory polyarthropathy: Secondary | ICD-10-CM | POA: Diagnosis not present

## 2021-11-29 DIAGNOSIS — R5383 Other fatigue: Secondary | ICD-10-CM | POA: Diagnosis not present

## 2021-12-13 DIAGNOSIS — M1991 Primary osteoarthritis, unspecified site: Secondary | ICD-10-CM | POA: Diagnosis not present

## 2021-12-13 DIAGNOSIS — M1A09X0 Idiopathic chronic gout, multiple sites, without tophus (tophi): Secondary | ICD-10-CM | POA: Diagnosis not present

## 2021-12-13 DIAGNOSIS — M5136 Other intervertebral disc degeneration, lumbar region: Secondary | ICD-10-CM | POA: Diagnosis not present

## 2021-12-22 ENCOUNTER — Other Ambulatory Visit: Payer: Self-pay | Admitting: Family Medicine

## 2021-12-25 ENCOUNTER — Other Ambulatory Visit: Payer: Self-pay | Admitting: Family Medicine

## 2021-12-25 NOTE — Telephone Encounter (Signed)
Pt will need an appointment  

## 2021-12-27 ENCOUNTER — Other Ambulatory Visit: Payer: Self-pay | Admitting: Family Medicine

## 2021-12-27 DIAGNOSIS — J209 Acute bronchitis, unspecified: Secondary | ICD-10-CM | POA: Diagnosis not present

## 2021-12-27 DIAGNOSIS — Z03818 Encounter for observation for suspected exposure to other biological agents ruled out: Secondary | ICD-10-CM | POA: Diagnosis not present

## 2022-01-21 DIAGNOSIS — M7072 Other bursitis of hip, left hip: Secondary | ICD-10-CM | POA: Diagnosis not present

## 2022-03-13 DIAGNOSIS — M5136 Other intervertebral disc degeneration, lumbar region: Secondary | ICD-10-CM | POA: Diagnosis not present

## 2022-03-13 DIAGNOSIS — M542 Cervicalgia: Secondary | ICD-10-CM | POA: Diagnosis not present

## 2022-03-13 DIAGNOSIS — M1A09X0 Idiopathic chronic gout, multiple sites, without tophus (tophi): Secondary | ICD-10-CM | POA: Diagnosis not present

## 2022-03-13 DIAGNOSIS — M1991 Primary osteoarthritis, unspecified site: Secondary | ICD-10-CM | POA: Diagnosis not present

## 2022-03-27 ENCOUNTER — Other Ambulatory Visit: Payer: Self-pay | Admitting: Family Medicine

## 2022-05-10 ENCOUNTER — Other Ambulatory Visit (HOSPITAL_COMMUNITY): Payer: Self-pay | Admitting: Orthopaedic Surgery

## 2022-05-10 DIAGNOSIS — Z96652 Presence of left artificial knee joint: Secondary | ICD-10-CM | POA: Diagnosis not present

## 2022-05-10 DIAGNOSIS — M25562 Pain in left knee: Secondary | ICD-10-CM

## 2022-05-22 ENCOUNTER — Encounter (HOSPITAL_COMMUNITY)
Admission: RE | Admit: 2022-05-22 | Discharge: 2022-05-22 | Disposition: A | Discharge status: Home / Self Care | Payer: BC Managed Care – PPO | Source: Ambulatory Visit | Attending: Orthopaedic Surgery | Admitting: Orthopaedic Surgery

## 2022-05-22 ENCOUNTER — Ambulatory Visit (HOSPITAL_COMMUNITY)
Admission: RE | Admit: 2022-05-22 | Discharge: 2022-05-22 | Disposition: A | Discharge status: Home / Self Care | Payer: BC Managed Care – PPO | Source: Ambulatory Visit | Attending: Orthopaedic Surgery | Admitting: Orthopaedic Surgery

## 2022-05-22 DIAGNOSIS — M7989 Other specified soft tissue disorders: Secondary | ICD-10-CM | POA: Diagnosis not present

## 2022-05-22 DIAGNOSIS — Z96653 Presence of artificial knee joint, bilateral: Secondary | ICD-10-CM | POA: Diagnosis not present

## 2022-05-22 DIAGNOSIS — Z471 Aftercare following joint replacement surgery: Secondary | ICD-10-CM | POA: Diagnosis not present

## 2022-05-22 DIAGNOSIS — M25562 Pain in left knee: Secondary | ICD-10-CM | POA: Insufficient documentation

## 2022-05-22 MED ORDER — TECHNETIUM TC 99M MEDRONATE IV KIT
20.0000 | PACK | Freq: Once | INTRAVENOUS | Status: AC | PRN
  Administered 2022-05-22: 20.7 via INTRAVENOUS

## 2022-05-27 ENCOUNTER — Ambulatory Visit (INDEPENDENT_AMBULATORY_CARE_PROVIDER_SITE_OTHER): Payer: BC Managed Care – PPO | Admitting: Family Medicine

## 2022-05-27 ENCOUNTER — Encounter: Payer: Self-pay | Admitting: Family Medicine

## 2022-05-27 VITALS — BP 125/86 | HR 75 | Temp 98.3°F | Ht 71.0 in | Wt 189.8 lb

## 2022-05-27 DIAGNOSIS — Z23 Encounter for immunization: Secondary | ICD-10-CM | POA: Diagnosis not present

## 2022-05-27 DIAGNOSIS — E7800 Pure hypercholesterolemia, unspecified: Secondary | ICD-10-CM

## 2022-05-27 DIAGNOSIS — Z125 Encounter for screening for malignant neoplasm of prostate: Secondary | ICD-10-CM | POA: Diagnosis not present

## 2022-05-27 DIAGNOSIS — I10 Essential (primary) hypertension: Secondary | ICD-10-CM | POA: Diagnosis not present

## 2022-05-27 DIAGNOSIS — R7303 Prediabetes: Secondary | ICD-10-CM | POA: Diagnosis not present

## 2022-05-27 DIAGNOSIS — Z0000 Encounter for general adult medical examination without abnormal findings: Secondary | ICD-10-CM

## 2022-05-27 DIAGNOSIS — M25562 Pain in left knee: Secondary | ICD-10-CM | POA: Diagnosis not present

## 2022-05-27 LAB — CBC
HCT: 47.5 % (ref 39.0–52.0)
Hemoglobin: 15.8 g/dL (ref 13.0–17.0)
MCHC: 33.3 g/dL (ref 30.0–36.0)
MCV: 91.5 fl (ref 78.0–100.0)
Platelets: 219 10*3/uL (ref 150.0–400.0)
RBC: 5.19 Mil/uL (ref 4.22–5.81)
RDW: 14.7 % (ref 11.5–15.5)
WBC: 7.5 10*3/uL (ref 4.0–10.5)

## 2022-05-27 LAB — LIPID PANEL
Cholesterol: 139 mg/dL (ref 0–200)
HDL: 56.1 mg/dL (ref >39.00)
LDL Cholesterol: 58 mg/dL (ref 0–99)
NonHDL: 82.47
Total CHOL/HDL Ratio: 2
Triglycerides: 121 mg/dL (ref 0.0–149.0)
VLDL: 24.2 mg/dL (ref 0.0–40.0)

## 2022-05-27 LAB — COMPREHENSIVE METABOLIC PANEL
ALT: 20 U/L (ref 0–53)
AST: 18 U/L (ref 0–37)
Albumin: 4.3 g/dL (ref 3.5–5.2)
Alkaline Phosphatase: 72 U/L (ref 39–117)
BUN: 15 mg/dL (ref 6–23)
CO2: 29 mEq/L (ref 19–32)
Calcium: 10 mg/dL (ref 8.4–10.5)
Chloride: 104 mEq/L (ref 96–112)
Creatinine, Ser: 0.85 mg/dL (ref 0.40–1.50)
GFR: 93.07 mL/min (ref >60.00)
Glucose, Bld: 101 mg/dL — ABNORMAL HIGH (ref 70–99)
Potassium: 5.3 mEq/L — ABNORMAL HIGH (ref 3.5–5.1)
Sodium: 140 mEq/L (ref 135–145)
Total Bilirubin: 0.8 mg/dL (ref 0.2–1.2)
Total Protein: 6.7 g/dL (ref 6.0–8.3)

## 2022-05-27 LAB — PSA: PSA: 1.79 ng/mL (ref 0.10–4.00)

## 2022-05-27 LAB — HEMOGLOBIN A1C: Hgb A1c MFr Bld: 5.9 % (ref 4.6–6.5)

## 2022-05-27 MED ORDER — LORAZEPAM 0.5 MG PO TABS: 0.5000 mg | ORAL_TABLET | Freq: Every day | ORAL | 0 refills | Status: DC | PRN

## 2022-05-27 NOTE — Progress Notes (Signed)
Office Note 05/27/2022  CC:  Chief Complaint  Patient presents with   Annual Exam    CPE  Pt is fasting. No other concerns.   HPI:  Patient is a 61 y.o. male who is here for annual health maintenance exam and follow-up hypertension. [place] feels good other than bilateral knee pain.  He is getting ongoing evaluation for this by his orthopedist.  Average home blood pressure is approximately 130/80.   PMP AWARE reviewed today: most recent rx for lorazepam was filled 02/06/2021, #30, rx by me. No red flags.   Past Medical History:  Diagnosis Date   Adenomatous colon polyp 12/2017   sessile serrated polyp at appendiceal orifice, not completely removed at endoscopy.  Pt had extended lap appendectomy to make sure entire polyp removed..   Allergy    Carpal tunnel syndrome on both sides    Diverticulosis    Elevated PSA    Repeat 1 mo later was back down.  No bx.  No urology referral.   Family history of prostate cancer    Father   GERD (gastroesophageal reflux disease)    History of colitis    Lymphocytic colitis; episodic flares (most recent approx 2015)   Hyperlipidemia    borderline--controlled with TLC.  2018 [facility] 10 yr CV risk = 8.1%: atorva started.   Hypertension    Insomnia    Kidney laceration    Hx of, 1980   Organic impotence    Osteoarthritis, multiple sites    neck/knees.  L knee tricompartmental bone on bone.  R knee mild to moderate osteoarthritis (Dr. French Ana)   Prediabetes    A1c consistently <6% until 03/2020->A1c 6.0%   Splenic laceration    Hx of, 1980    Past Surgical History:  Procedure Laterality Date   BILATERAL CARPAL TUNNEL RELEASE Bilateral    CERVICAL FUSION  approx 1999   C5-6   COLONOSCOPY  12/2006; 01/2009; 12/2017   [place], [place]--Dr. Ann Lions:  2008 (rectal bleeding)-Nonadenomatous polyp: rpt 10 yrs rec'd.  2010: RLQ pain and diarrhea--lymphocytic colitis.  Ileal polyp-benign, diverticulosis. 01/19/18 1  sess serr polyp at appendiceal orifice--incompletely removed.  GI referred him to gen surg for extended appendectomy.  06/2021 NO POLYPS. recall 5 yrs   KNEE SURGERY     Ex baseball player.  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)   LAPAROSCOPIC APPENDECTOMY N/A 03/23/2018   Procedure: LAPAROSCOPIC APPENDECTOMY;  Surgeon: Georganna Skeans, MD;  Location: [facility];  Service: General;  Laterality: N/A;   POLYPECTOMY     TONSILLECTOMY AND ADENOIDECTOMY  1968   TOTAL KNEE ARTHROPLASTY Right    TOTAL KNEE REVISION Left 06/27/2020   Procedure: LEFT TOTAL KNEE REVISION (POLY AND TIBIA);  Surgeon: Melrose Nakayama, MD;  Location: [facility];  Service: Orthopedics;  Laterality: Left;x 2    Family History  Problem Relation Age of Onset   Breast cancer Mother    Heart disease Mother    Colon polyps Father    Arthritis Father    Prostate cancer Father    Stroke Father    Hypertension Father    Colon cancer Neg Hx    Rectal cancer Neg Hx    Stomach cancer Neg Hx    Esophageal cancer Neg Hx     Social History   Socioeconomic History   Marital status: Married    Spouse name: Not on file   Number of children: Not on file   Years of education: Not on  file   Highest education level: Not on file  Occupational History   Not on file  Tobacco Use   Smoking status: Never   Smokeless tobacco: Former    Types: Nurse, children's Use: Never used  Substance and Sexual Activity   Alcohol use: Yes    Comment: recreational    Drug use: No   Sexual activity: Yes  Other Topics Concern   Not on file  Social History Narrative   Married, 2 daughters.   Educ: BS   Occupation: Freight forwarder for [employer]      No [place] or drugs.  Occ alcohol.   Social Determinants of Health   Financial Resource Strain: Not on file  Food Insecurity: Not on file  Transportation Needs: Not on file  Physical Activity: Not on file  Stress: Not on file  Social Connections: Not on file  Intimate Partner Violence: Not on  file    Outpatient Medications Prior to Visit  Medication Sig Dispense Refill   allopurinol (ZYLOPRIM) 300 MG tablet Take 300 mg by mouth daily.     atorvastatin (LIPITOR) 40 MG tablet TAKE 1 TABLET BY MOUTH EVERY DAY 90 tablet 0   calcium carbonate (TUMS - DOSED IN MG ELEMENTAL CALCIUM) 500 MG chewable tablet Chew 2-3 tablets by mouth daily as needed for indigestion or heartburn.     lisinopril (ZESTRIL) 10 MG tablet Take 1 tablet (10 mg total) by mouth daily. 90 tablet 3   Multiple Vitamin (MULTIVITAMIN WITH MINERALS) TABS tablet Take 1 tablet by mouth daily.     celecoxib (CELEBREX) 200 MG capsule TAKE 1 CAPSULE BY MOUTH ONCE A DAY AS NEEDED FOR PAIN WITH FOOD (Patient not taking: Reported on 05/27/2022)     gabapentin (NEURONTIN) 300 MG capsule Take 300 mg by mouth at bedtime. (Patient not taking: Reported on 05/27/2022)     PENNSAID 2 % SOLN SMARTSIG:1 Pump Topical Twice Daily PRN (Patient not taking: Reported on 06/18/2021)     LORazepam (ATIVAN) 0.5 MG tablet TAKE 1 TABLET BY MOUTH EVERY DAY AS NEEDED 30 tablet 1   No facility-administered medications prior to visit.    No Known Allergies  ROS Review of Systems  Constitutional:  Negative for appetite change, chills, fatigue and fever.  HENT:  Negative for congestion, dental problem, ear pain and sore throat.   Eyes:  Negative for discharge, redness and visual disturbance.  Respiratory:  Negative for cough, chest tightness, shortness of breath and wheezing.   Cardiovascular:  Negative for chest pain, palpitations and leg swelling.  Gastrointestinal:  Negative for abdominal pain, blood in stool, diarrhea, nausea and vomiting.  Genitourinary:  Negative for difficulty urinating, dysuria, flank pain, frequency, hematuria and urgency.  Musculoskeletal:  Positive for arthralgias (both knees). Negative for back pain, joint swelling, myalgias and neck stiffness.  Skin:  Negative for pallor and rash.  Neurological:  Negative for  dizziness, speech difficulty, weakness and headaches.  Hematological:  Negative for adenopathy. Does not bruise/bleed easily.  Psychiatric/Behavioral:  Negative for confusion and sleep disturbance. The patient is not nervous/anxious.     PE;    05/27/2022    9:36 AM 07/16/2021    2:58 PM 07/16/2021    2:48 PM  Vitals with BMI  Height '5\' 11"'$     Weight 189 lbs 13 oz    BMI 98.11    Systolic [phone]  Diastolic 86 77 71  Pulse 75 71 72   Gen: Alert, well appearing.  Patient is oriented to person, place, time, and situation. AFFECT: pleasant, lucid thought and speech. ENT: Ears: EACs clear, normal epithelium.  TMs with good light reflex and landmarks bilaterally.  Eyes: no injection, icteris, swelling, or exudate.  EOMI, PERRLA. Nose: no drainage or turbinate edema/swelling.  No injection or focal lesion.  Mouth: lips without lesion/swelling.  Oral mucosa pink and moist.  Dentition intact and without obvious caries or gingival swelling.  Oropharynx without erythema, exudate, or swelling.  Neck: supple/nontender.  No LAD, mass, or TM.  Carotid pulses 2+ bilaterally, without bruits. CV: RRR, no m/r/g.   LUNGS: CTA bilat, nonlabored resps, good aeration in all lung fields. ABD: soft, NT, ND, BS normal.  No hepatospenomegaly or mass.  No bruits. EXT: no clubbing, cyanosis, or edema.  Musculoskeletal: Bilateral bony hypertrophy of knees.  No obvious effusion.  Range of motion fully intact.  No erythema or warmth.  No significant tenderness to palpation.   Otherwise, no joint swelling, erythema, warmth, or tenderness.  ROM of all joints intact. Skin - no sores or suspicious lesions or rashes or color changes  Pertinent labs:  Lab Results  Component Value Date   TSH 1.80 12/20/2020   Lab Results  Component Value Date   WBC 6.4 12/20/2020   HGB 15.6 12/20/2020   HCT 46.6 12/20/2020   MCV 90.3 12/20/2020   PLT 233.0 12/20/2020   Lab Results  Component Value Date   CREATININE 0.89  12/20/2020   BUN 19 12/20/2020   NA 138 12/20/2020   K 4.4 12/20/2020   CL 102 12/20/2020   CO2 28 12/20/2020   Lab Results  Component Value Date   ALT 22 12/20/2020   AST 17 12/20/2020   ALKPHOS 73 12/20/2020   BILITOT 0.9 12/20/2020   Lab Results  Component Value Date   CHOL 129 12/20/2020   Lab Results  Component Value Date   HDL 47.60 12/20/2020   Lab Results  Component Value Date   LDLCALC 57 12/20/2020   Lab Results  Component Value Date   TRIG 125.0 12/20/2020   Lab Results  Component Value Date   CHOLHDL 3 12/20/2020   Lab Results  Component Value Date   PSA 1.81 12/20/2020   PSA 2.72 02/23/2019   PSA 2.27 05/09/2017   Lab Results  Component Value Date   HGBA1C 5.6 12/20/2020   ASSESSMENT AND PLAN:   1) Health maintenance exam: Reviewed age and gender appropriate health maintenance issues (prudent diet, regular exercise, health risks of tobacco and excessive alcohol, use of seatbelts, fire alarms in home, use of sunscreen).  Also reviewed age and gender appropriate health screening as well as vaccine recommendations. Vaccines: Flu->given today. otherwise up-to-date Labs: CBC, CMet, lipid panel, hemoglobin A1c, PSA Prostate ca screening: PSA today Colon ca screening: History of polyps.  07/16/2021 colonoscopy did not show any polyps, though.  2) hypertension, well controlled on lisinopril 10 mg a day.  3 episodic anxiety and anxiety-related insomnia. He uses small amounts of lorazepam infrequently. #30 of the 0.5 mg lorazepam tabs prescribed today.  No refill.  #4 hypercholesterolemia.  Doing well on atorvastatin 40 mg a day. Lipid panel and hepatic panel today.  #5 prediabetes. Fasting glucose today. Hemoglobin A1c today.  An After Visit Summary was printed and given to the patient.  FOLLOW UP:  Return in about 6 months (around 11/26/2022) for routine chronic illness f/u.  Signed:  Crissie Sickles, MD  05/27/2022  

## 2022-05-27 NOTE — Patient Instructions (Signed)
Health Maintenance, Male Adopting a healthy lifestyle and getting preventive care are important in promoting health and wellness. Ask your health care provider about: The right schedule for you to have regular tests and exams. Things you can do on your own to prevent diseases and keep yourself healthy. What should I know about diet, weight, and exercise? Eat a healthy diet  Eat a diet that includes plenty of vegetables, fruits, low-fat dairy products, and lean protein. Do not eat a lot of foods that are high in solid fats, added sugars, or sodium. Maintain a healthy weight Body mass index (BMI) is a measurement that can be used to identify possible weight problems. It estimates body fat based on height and weight. Your health care provider can help determine your BMI and help you achieve or maintain a healthy weight. Get regular exercise Get regular exercise. This is one of the most important things you can do for your health. Most adults should: Exercise for at least 150 minutes each week. The exercise should increase your heart rate and make you sweat (moderate-intensity exercise). Do strengthening exercises at least twice a week. This is in addition to the moderate-intensity exercise. Spend less time sitting. Even light physical activity can be beneficial. Watch cholesterol and blood lipids Have your blood tested for lipids and cholesterol at 62 years of age, then have this test every 5 years. You may need to have your cholesterol levels checked more often if: Your lipid or cholesterol levels are high. You are older than 62 years of age. You are at high risk for heart disease. What should I know about cancer screening? Many types of cancers can be detected early and may often be prevented. Depending on your health history and family history, you may need to have cancer screening at various ages. This may include screening for: Colorectal cancer. Prostate cancer. Skin cancer. Lung  cancer. What should I know about heart disease, diabetes, and high blood pressure? Blood pressure and heart disease High blood pressure causes heart disease and increases the risk of stroke. This is more likely to develop in people who have high blood pressure readings or are overweight. Talk with your health care provider about your target blood pressure readings. Have your blood pressure checked: Every 3-5 years if you are 18-39 years of age. Every year if you are 40 years old or older. If you are between the ages of 65 and 75 and are a current or former smoker, ask your health care provider if you should have a one-time screening for abdominal aortic aneurysm (AAA). Diabetes Have regular diabetes screenings. This checks your fasting blood sugar level. Have the screening done: Once every three years after age 45 if you are at a normal weight and have a low risk for diabetes. More often and at a younger age if you are overweight or have a high risk for diabetes. What should I know about preventing infection? Hepatitis B If you have a higher risk for hepatitis B, you should be screened for this virus. Talk with your health care provider to find out if you are at risk for hepatitis B infection. Hepatitis C Blood testing is recommended for: Everyone born from 1945 through 1965. Anyone with known risk factors for hepatitis C. Sexually transmitted infections (STIs) You should be screened each year for STIs, including gonorrhea and chlamydia, if: You are sexually active and are younger than 62 years of age. You are older than 62 years of age and your   health care provider tells you that you are at risk for this type of infection. Your sexual activity has changed since you were last screened, and you are at increased risk for chlamydia or gonorrhea. Ask your health care provider if you are at risk. Ask your health care provider about whether you are at high risk for HIV. Your health care provider  may recommend a prescription medicine to help prevent HIV infection. If you choose to take medicine to prevent HIV, you should first get tested for HIV. You should then be tested every 3 months for as long as you are taking the medicine. Follow these instructions at home: Alcohol use Do not drink alcohol if your health care provider tells you not to drink. If you drink alcohol: Limit how much you have to 0-2 drinks a day. Know how much alcohol is in your drink. In the [place] one drink equals one 12 oz bottle of beer (355 mL), one 5 oz glass of wine (148 mL), or one 1 oz glass of hard liquor (44 mL). Lifestyle Do not use any products that contain nicotine or tobacco. These products include cigarettes, chewing tobacco, and vaping devices, such as e-cigarettes. If you need help quitting, ask your health care provider. Do not use street drugs. Do not share needles. Ask your health care provider for help if you need support or information about quitting drugs. General instructions Schedule regular health, dental, and eye exams. Stay current with your vaccines. Tell your health care provider if: You often feel depressed. You have ever been abused or do not feel safe at home. Summary Adopting a healthy lifestyle and getting preventive care are important in promoting health and wellness. Follow your health care provider's instructions about healthy diet, exercising, and getting tested or screened for diseases. Follow your health care provider's instructions on monitoring your cholesterol and blood pressure. This information is not intended to replace advice given to you by your health care provider. Make sure you discuss any questions you have with your health care provider. Document Revised: 12/04/2020 Document Reviewed: 12/04/2020 Elsevier Patient Education  2023 Elsevier Inc.  

## 2022-05-28 ENCOUNTER — Telehealth: Payer: Self-pay

## 2022-05-28 DIAGNOSIS — E875 Hyperkalemia: Secondary | ICD-10-CM

## 2022-05-28 NOTE — Telephone Encounter (Signed)
-----   Message from Tammi Sou, MD sent at 05/28/2022  8:10 AM EDT ----- All labs excellent except potassium just a little bit high. No changes in meds at this time.  I would like him to come in for lab visit at his earliest convenience for repeat non-fasting basic metabolic panel to confirm the potassium level. Diagnosis hyperkalemia.

## 2022-05-31 ENCOUNTER — Other Ambulatory Visit (INDEPENDENT_AMBULATORY_CARE_PROVIDER_SITE_OTHER): Payer: BC Managed Care – PPO

## 2022-05-31 DIAGNOSIS — E875 Hyperkalemia: Secondary | ICD-10-CM | POA: Diagnosis not present

## 2022-05-31 DIAGNOSIS — E7800 Pure hypercholesterolemia, unspecified: Secondary | ICD-10-CM

## 2022-05-31 DIAGNOSIS — I10 Essential (primary) hypertension: Secondary | ICD-10-CM

## 2022-05-31 LAB — BASIC METABOLIC PANEL
BUN: 12 mg/dL (ref 6–23)
CO2: 30 mEq/L (ref 19–32)
Calcium: 9.9 mg/dL (ref 8.4–10.5)
Chloride: 104 mEq/L (ref 96–112)
Creatinine, Ser: 0.83 mg/dL (ref 0.40–1.50)
GFR: 93.74 mL/min (ref >60.00)
Glucose, Bld: 107 mg/dL — ABNORMAL HIGH (ref 70–99)
Potassium: 5 mEq/L (ref 3.5–5.1)
Sodium: 139 mEq/L (ref 135–145)

## 2022-05-31 LAB — CBC
HCT: 46.2 % (ref 39.0–52.0)
Hemoglobin: 15.4 g/dL (ref 13.0–17.0)
MCHC: 33.2 g/dL (ref 30.0–36.0)
MCV: 90.7 fl (ref 78.0–100.0)
Platelets: 234 10*3/uL (ref 150.0–400.0)
RBC: 5.1 Mil/uL (ref 4.22–5.81)
RDW: 14.7 % (ref 11.5–15.5)
WBC: 5.9 10*3/uL (ref 4.0–10.5)

## 2022-06-05 DIAGNOSIS — M1991 Primary osteoarthritis, unspecified site: Secondary | ICD-10-CM | POA: Diagnosis not present

## 2022-06-05 DIAGNOSIS — M5136 Other intervertebral disc degeneration, lumbar region: Secondary | ICD-10-CM | POA: Diagnosis not present

## 2022-06-05 DIAGNOSIS — M064 Inflammatory polyarthropathy: Secondary | ICD-10-CM | POA: Diagnosis not present

## 2022-06-05 DIAGNOSIS — M1A09X0 Idiopathic chronic gout, multiple sites, without tophus (tophi): Secondary | ICD-10-CM | POA: Diagnosis not present

## 2022-06-17 ENCOUNTER — Ambulatory Visit: Payer: BC Managed Care – PPO | Admitting: Internal Medicine

## 2022-06-24 ENCOUNTER — Encounter: Payer: BC Managed Care – PPO | Admitting: Internal Medicine

## 2022-06-24 ENCOUNTER — Ambulatory Visit (INDEPENDENT_AMBULATORY_CARE_PROVIDER_SITE_OTHER): Payer: BC Managed Care – PPO | Admitting: Internal Medicine

## 2022-06-24 ENCOUNTER — Encounter: Payer: Self-pay | Admitting: Internal Medicine

## 2022-06-24 VITALS — BP 116/78 | HR 83 | Temp 97.9°F | Resp 18 | Ht 72.0 in | Wt 198.4 lb

## 2022-06-24 DIAGNOSIS — L230 Allergic contact dermatitis due to metals: Secondary | ICD-10-CM | POA: Diagnosis not present

## 2022-06-24 NOTE — Patient Instructions (Addendum)
Discussed with patient that patch testing tests for contact dermatitis and sometimes it does not correlate to how one will react to metals in the body. Positive patch testing results can help in avoiding those items however it is possible to get false negative results.  Nevertheless, this is the most accessible test for metal sensitivity currently available.  Patches placed today. Please avoid strenuous physical activities and do not get the patches on the back wet. No showering until final patch reading done. Okay to take antihistamines for itching but avoid placing any creams on the back where the patches are. We will remove the patches on Wednesday and will do our initial read. Then you will come back on Friday for a final read.  Follow up: Wednesday, Friday and Monday for metal patch testing reads   Thank you so much for letting me partake in your care today.  Don't hesitate to reach out if you have any additional concerns!  Roney Marion, MD  [facility] [place], [place]

## 2022-06-24 NOTE — Progress Notes (Signed)
New Patient Note  RE: Billy Bates MRN: [id number] DOB: March 10, 1961 Date of Office Visit: 06/24/2022  Consult requested by: Melrose Nakayama, MD Primary care provider: Tammi Sou, MD  Chief Complaint: Allergies and Other (Possible metal allergies, pt states that he had 2 knee replacements and since he's been experiencing a lot of inflammation all over his body anywhere there's a joint. He was sent by PCP for metal testing.)  History of Present Illness: I had the pleasure of seeing Billy Bates for initial evaluation at the [facility] [place] [facility] on 06/24/2022. He is a 61 y.o. male, who is referred here by McGowen, Adrian Blackwater, MD for the evaluation of metal allergy .  History obtained from patient, chart review.  He had a left knee replacement in May 0086 with complications which resulted in a revision (displacement).  Right knee replacement was done in March in 2021. He initially had a good recovery for the first year, then symptoms started.   Since then he has had persistent pain and inflammation in both knees, hands, wrists.  He has had synovial fluid analysis which was negative for any infection He will get red hot swollen joints with effusions.  He has been seen by rheumatology with no clear etiology for arthritis based on serology.  He is currently on plaquenil for management for 1 month without response.   Prosthetic contains colbalt-chromium-molybdenum alloy; cast  ASTM F 75, poylethylene plastic spacer   Work up:  NM- Bone scan 3 phases  IMPRESSION: Increased blood flow and blood pool of tracer at both knees consistent with hyperemia and question synovitis.   Focal increased tracer uptake at RIGHT medial and lateral tibial plateau adjacent to the prosthesis concerning for aseptic loosening.   Uptake at the LEFT knee prosthesis, favoring postoperative changes though aseptic loosening is not completely excluded.    Assessment and Plan: Billy Bates is a 61  y.o. male with: Allergic contact dermatitis due to metals - Plan: Patch Test Plan: Patient Instructions  Discussed with patient that patch testing tests for contact dermatitis and sometimes it does not correlate to how one will react to metals in the body. Positive patch testing results can help in avoiding those items however it is possible to get false negative results.  Nevertheless, this is the most accessible test for metal sensitivity currently available.  Patches placed today. Please avoid strenuous physical activities and do not get the patches on the back wet. No showering until final patch reading done. Okay to take antihistamines for itching but avoid placing any creams on the back where the patches are. We will remove the patches on Wednesday and will do our initial read. Then you will come back on Friday for a final read.  Follow up: Wednesday, Friday and Monday for metal patch testing reads   Thank you so much for letting me partake in your care today.  Don't hesitate to reach out if you have any additional concerns!  Roney Marion, MD  [facility] [place] [place]    No orders of the defined types were placed in this encounter.  Lab Orders  No laboratory test(s) ordered today    Other allergy screening: Asthma: no Rhino conjunctivitis: no Food allergy: no Medication allergy: no Hymenoptera allergy: no Urticaria: no Eczema:no History of recurrent infections suggestive of immunodeficency: no  Diagnostics:  Metal patch tests placed.       Past Medical History: Patient Active Problem List   Diagnosis  Date Noted   Overweight (BMI 25.0-29.9) 02/23/2019   Prostate cancer screening 02/09/2016   Past Medical History:  Diagnosis Date   Adenomatous colon polyp 12/2017   sessile serrated polyp at appendiceal orifice, not completely removed at endoscopy.  Pt had extended lap appendectomy to make sure entire polyp removed..   Allergy    Carpal  tunnel syndrome on both sides    Diverticulosis    Elevated PSA    Repeat 1 mo later was back down.  No bx.  No urology referral.   Family history of prostate cancer    Father   GERD (gastroesophageal reflux disease)    History of colitis    Lymphocytic colitis; episodic flares (most recent approx 2015)   Hyperlipidemia    borderline--controlled with TLC.  2018 Fr[facility]ham 10 yr CV risk = 8.1%: atorva started.   Hypertension    Insomnia    Kidney laceration    Hx of, 1980   Organic impotence    Osteoarthritis, multiple sites    neck/knees.  L knee tricompartmental bone on bone.  R knee mild to moderate osteoarthritis (Dr. French Ana)   Prediabetes    A1c consistently <6% until 03/2020->A1c 6.0%   Splenic laceration    Hx of, 1980   Past Surgical History: Past Surgical History:  Procedure Laterality Date   BILATERAL CARPAL TUNNEL RELEASE Bilateral    CERVICAL FUSION  approx 1999   C5-6   COLONOSCOPY  12/2006; 01/2009; 12/2017   [facility] in [place], [place]--Dr. Ann Lions:  2008 (rectal bleeding)-Nonadenomatous polyp: rpt 10 yrs rec'd.  2010: RLQ pain and diarrhea--lymphocytic colitis.  Ileal polyp-benign, diverticulosis. 01/19/18 1 sess serr polyp at appendiceal orifice--incompletely removed.  GI referred him to gen surg for extended appendectomy.  06/2021 NO POLYPS. recall 5 yrs   KNEE SURGERY     Ex baseball player.  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)   LAPAROSCOPIC APPENDECTOMY N/A 03/23/2018   Procedure: LAPAROSCOPIC APPENDECTOMY;  Surgeon: Georganna Skeans, MD;  Location: [facility];  Service: General;  Laterality: N/A;   POLYPECTOMY     TONSILLECTOMY AND ADENOIDECTOMY  1968   TOTAL KNEE ARTHROPLASTY Right    TOTAL KNEE REVISION Left 06/27/2020   Procedure: LEFT TOTAL KNEE REVISION (POLY AND TIBIA);  Surgeon: Melrose Nakayama, MD;  [facility];  Service: Orthopedics;  Laterality: Left;x 2   Medication List:  Current Outpatient Medications  Medication Sig  Dispense Refill   allopurinol (ZYLOPRIM) 300 MG tablet Take 300 mg by mouth daily.     atorvastatin (LIPITOR) 40 MG tablet TAKE 1 TABLET BY MOUTH EVERY DAY 90 tablet 0   calcium carbonate (TUMS - DOSED IN MG ELEMENTAL CALCIUM) 500 MG chewable tablet Chew 2-3 tablets by mouth daily as needed for indigestion or heartburn.     lisinopril (ZESTRIL) 10 MG tablet Take 1 tablet (10 mg total) by mouth daily. 90 tablet 3   LORazepam (ATIVAN) 0.5 MG tablet Take 1 tablet (0.5 mg total) by mouth daily as needed. 30 tablet 0   Multiple Vitamin (MULTIVITAMIN WITH MINERALS) TABS tablet Take 1 tablet by mouth daily.     celecoxib (CELEBREX) 200 MG capsule TAKE 1 CAPSULE BY MOUTH ONCE A DAY AS NEEDED FOR PAIN WITH FOOD (Patient not taking: Reported on 05/27/2022)     gabapentin (NEURONTIN) 300 MG capsule Take 300 mg by mouth at bedtime. (Patient not taking: Reported on 05/27/2022)     PENNSAID 2 % SOLN SMARTSIG:1 Pump Topical Twice Daily PRN (  Patient not taking: Reported on 06/18/2021)     No current facility-administered medications for this visit.   Allergies: No Known Allergies Social History: Social History   Socioeconomic History   Marital status: Married    Spouse name: Not on file   Number of children: Not on file   Years of education: Not on file   Highest education level: Not on file  Occupational History   Not on file  Tobacco Use   Smoking status: Never   Smokeless tobacco: Former    Types: Nurse, children's Use: Never used  Substance and Sexual Activity   Alcohol use: Yes    Comment: recreational    Drug use: No   Sexual activity: Yes  Other Topics Concern   Not on file  Social History Narrative   Married, 2 daughters.   Educ: BS   Occupation: Freight forwarder for [employer]      No [place] or drugs.  Occ alcohol.   Social Determinants of Health   Financial Resource Strain: Not on file  Food Insecurity: Not on file  Transportation Needs: Not on file  Physical Activity: Not on  file  Stress: Not on file  Social Connections: Not on file   Lives in a single-family home this years old.  There are no roaches in the house and bed is 2 feet off the floor.  There are dust mite precautions on bed and pillows.  He is not exposed to fumes, chemicals or dust.  There is a HEPA filter in the home and home is not near an interstate or industrial area. Smoking: No exposure Occupation: Retired  Programme researcher, broadcasting/film/video HistoryFreight forwarder in the house: no Charity fundraiser in the family room: no Carpet in the bedroom: yes Heating: heat pump Cooling: heat pump Pet: yes dog with access to bedroom  Family History: Family History  Problem Relation Age of Onset   Breast cancer Mother    Heart disease Mother    Colon polyps Father    Arthritis Father    Prostate cancer Father    Stroke Father    Hypertension Father    Colon cancer Neg Hx    Rectal cancer Neg Hx    Stomach cancer Neg Hx    Esophageal cancer Neg Hx      ROS: All others negative except as noted per HPI.   Objective: BP 116/78   Pulse 83   Temp 97.9 F (36.6 C) (Temporal)   Resp 18   Ht 6' (1.829 m)   Wt 198 lb 6.4 oz (90 kg)   SpO2 98%   BMI 26.91 kg/m  Body mass index is 26.91 kg/m.  General Appearance:  Alert, cooperative, no distress, appears stated age  Head:  Normocephalic, without obvious abnormality, atraumatic  Eyes:  Conjunctiva clear, EOM's intact  Nose: Nares normal,   Throat: Lips, tongue normal; teeth and gums normal,   Neck: Supple, symmetrical  Lungs:   , Respirations unlabored, no coughing  Heart:  , Appears well perfused  Extremities: No edema  Skin: Skin color, texture, turgor normal, no rashes or lesions on visualized portions of skin  Neurologic: No gross deficits   The plan was reviewed with the patient/family, and all questions/concerned were addressed.  It was my pleasure to see Leobardo today and participate in his care. Please feel free to contact me with any questions or  concerns.  Sincerely,  Roney Marion, MD Allergy & Immunology  [facility] office: [phone] [employer] office: [phone]

## 2022-06-26 ENCOUNTER — Ambulatory Visit: Payer: BC Managed Care – PPO | Admitting: Internal Medicine

## 2022-06-26 DIAGNOSIS — L230 Allergic contact dermatitis due to metals: Secondary | ICD-10-CM

## 2022-06-26 NOTE — Progress Notes (Unsigned)
   Follow Up Note  RE: Billy Bates MRN: [id number] DOB: Jul 08, 1961 Date of Office Visit: 06/26/2022  Referring provider: Tammi Sou, MD Primary care provider: Tammi Sou, MD  History of Present Illness: I had the pleasure of seeing Billy Bates for a follow up visit at the [facility] [place] [facility] [place] on 06/27/2022. He is a 61 y.o. male, who is being followed for allergic contact dermatitis to metals. Today he is here for initial patch test interpretation, given suspected history of contact dermatitis.   Diagnostics:  Metals  48 hour reading:   Questionable HM:CNOBSJGGE chloride 1%, nickel sulfate hexahydrate 5%  Negative: vanadium pentoxide 10%. chromicum chloride 1%, cobalt chloride hexahydrate 1%, molybdenum chloride 0.5%, tantal 1%, nickel sulfate hexahydrate 5%, potassium dichromate 0.25%, copper sulfate pentahydrate 2%, titanium 0.1%, manganese chloride 0.5% and aluminium hydroxide 10%.    Metals Patch - 06/26/22 1300     Aluminum Hydroxide 10% 0    Chromium chloride 1% --   ?   Cobalt chloride hexahydrate 1% 0    Molybdenum chloride 0.5% 0    Nickel sulfate hexahydrate 5% --   ?   Potassium dichromate 0.25% 0    Copper sulfate pentahydrate 2% 0    Tantal 1% 0    Titanium 0.1% 0    Manganese chloride 0.5% 0    Vanadium Pentoxide 10% 0    Other Omitted              Assessment and Plan: Billy Bates is a 61 y.o. male with: Concern for Contact Dermatitis:  The patient has been provided detailed information regarding the substances he is sensitive to, as well as products containing the substances.  Meticulous avoidance of these substances is recommended. If avoidance is not possible, the use of barrier creams or lotions is recommended. If symptoms persist or progress despite meticulous avoidance of chemicals/substances above, dermatology evaluation may be warranted. RTC on Friday for second patch test read   It was my pleasure to see Billy Bates today and  participate in his care. Please feel free to contact me with any questions or concerns.  Sincerely,   Roney Marion, MD [facility] [place]

## 2022-06-28 ENCOUNTER — Encounter: Payer: Self-pay | Admitting: Family

## 2022-06-28 ENCOUNTER — Ambulatory Visit: Payer: BC Managed Care – PPO | Admitting: Family

## 2022-06-28 DIAGNOSIS — L230 Allergic contact dermatitis due to metals: Secondary | ICD-10-CM

## 2022-06-28 NOTE — Progress Notes (Signed)
Follow-up Note  RE: Billy Bates MRN: [id number] DOB: 07-14-1961 Date of Office Visit: 06/28/2022  Primary care provider: Tammi Sou, MD Referring provider: Tammi Sou, MD   Abdullahi returns to the office today for the patch test placement, given suspected history of contact dermatitis.    Diagnostics: metal patch test 96 hour reading: all negative   Plan:   Allergic contact dermatitis - Keep your 1 week metal patch reading appointment on Monday  Althea Charon, [place] [place] [facility] [place]

## 2022-07-01 ENCOUNTER — Ambulatory Visit: Payer: BC Managed Care – PPO | Admitting: Internal Medicine

## 2022-07-01 DIAGNOSIS — L2389 Allergic contact dermatitis due to other agents: Secondary | ICD-10-CM

## 2022-07-01 NOTE — Progress Notes (Signed)
   Follow Up Note  RE: Billy Bates MRN: [id number] DOB: [birth year] Date of Office Visit: 07/01/2022  Referring provider: Tammi Sou, MD Primary care provider: Tammi Sou, MD  History of Present Illness: I had the pleasure of seeing Billy Bates for a follow up visit at the [facility] [place] [facility] [place] on 07/01/2022. He is a 61 y.o. male, who is being followed for possible allergic contact dermatitis to metals . Today he is here for final patch test interpretation, given suspected history of contact dermatitis.   Diagnostics:  Metals   7 day read  reading: negative to vanadium pentoxide 10%.: chromicum chloride 1%, cobalt chloride hexahydrate 1%, molybdenum chloride 0.5%, tantal 1%, nickel sulfate hexahydrate 5%, potassium dichromate 0.25%, copper sulfate pentahydrate 2%, titanium 0.1%, manganese chloride 0.5% and aluminium hydroxide 10%.      Assessment and Plan: Billy Bates is a 62 y.o. male with: Concern for Contact Dermatitis:  Patch testing was negative to:   vanadium pentoxide 10%. chromicum chloride 1%,  cobalt chloride hexahydrate 1%,  molybdenum chloride 0.5%,  tantal 1%,  nickel sulfate hexahydrate 5%,  potassium dichromate 0.25%,  copper sulfate pentahydrate 2%,  titanium 0.1%,  manganese chloride 0.5% aluminium hydroxide 10%.   Recommend bring these results to orthopedic surgeons to see what the next step is for managing of arthritis  Follow up as needed   It was my pleasure to see Billy Bates today and participate in his care. Please feel free to contact me with any questions or concerns.  Sincerely,   Roney Marion, MD [facility] [place]

## 2022-07-01 NOTE — Patient Instructions (Signed)
Possible Metal allergic contact dermatitis   Patch testing was negative to:   vanadium pentoxide 10%. chromicum chloride 1%,  cobalt chloride hexahydrate 1%,  molybdenum chloride 0.5%,  tantal 1%,  nickel sulfate hexahydrate 5%,  potassium dichromate 0.25%,  copper sulfate pentahydrate 2%,  titanium 0.1%,  manganese chloride 0.5% aluminium hydroxide 10%.   Recommend bring these results to orthopedic surgeons to see what the next step is for managing of arthritis   Follow up: as needed   Thank you so much for letting me partake in your care today.  Don't hesitate to reach out if you have any additional concerns!  Roney Marion, MD  [facility] [place], [place]

## 2022-07-15 ENCOUNTER — Other Ambulatory Visit: Payer: Self-pay | Admitting: Family Medicine

## 2022-07-23 DIAGNOSIS — M25552 Pain in left hip: Secondary | ICD-10-CM | POA: Diagnosis not present

## 2022-07-23 DIAGNOSIS — M7062 Trochanteric bursitis, left hip: Secondary | ICD-10-CM | POA: Diagnosis not present

## 2022-08-08 DIAGNOSIS — M170 Bilateral primary osteoarthritis of knee: Secondary | ICD-10-CM | POA: Diagnosis not present

## 2022-08-08 DIAGNOSIS — M1711 Unilateral primary osteoarthritis, right knee: Secondary | ICD-10-CM | POA: Diagnosis not present

## 2022-08-08 DIAGNOSIS — T8484XD Pain due to internal orthopedic prosthetic devices, implants and grafts, subsequent encounter: Secondary | ICD-10-CM | POA: Diagnosis not present

## 2022-08-08 DIAGNOSIS — M1712 Unilateral primary osteoarthritis, left knee: Secondary | ICD-10-CM | POA: Diagnosis not present

## 2022-09-11 DIAGNOSIS — M064 Inflammatory polyarthropathy: Secondary | ICD-10-CM | POA: Diagnosis not present

## 2022-09-11 DIAGNOSIS — M1A09X0 Idiopathic chronic gout, multiple sites, without tophus (tophi): Secondary | ICD-10-CM | POA: Diagnosis not present

## 2022-09-11 DIAGNOSIS — M1991 Primary osteoarthritis, unspecified site: Secondary | ICD-10-CM | POA: Diagnosis not present

## 2022-09-11 DIAGNOSIS — M5136 Other intervertebral disc degeneration, lumbar region: Secondary | ICD-10-CM | POA: Diagnosis not present

## 2022-09-16 DIAGNOSIS — M25562 Pain in left knee: Secondary | ICD-10-CM | POA: Diagnosis not present

## 2022-09-16 DIAGNOSIS — Z96653 Presence of artificial knee joint, bilateral: Secondary | ICD-10-CM | POA: Diagnosis not present

## 2022-09-16 DIAGNOSIS — G8929 Other chronic pain: Secondary | ICD-10-CM | POA: Diagnosis not present

## 2022-09-16 DIAGNOSIS — M25561 Pain in right knee: Secondary | ICD-10-CM | POA: Diagnosis not present

## 2022-09-25 ENCOUNTER — Ambulatory Visit (INDEPENDENT_AMBULATORY_CARE_PROVIDER_SITE_OTHER): Payer: BC Managed Care – PPO | Admitting: Family Medicine

## 2022-09-25 ENCOUNTER — Encounter: Payer: Self-pay | Admitting: Family Medicine

## 2022-09-25 VITALS — BP 100/65 | HR 83 | Temp 98.0°F | Ht 72.0 in | Wt 197.0 lb

## 2022-09-25 DIAGNOSIS — R0789 Other chest pain: Secondary | ICD-10-CM

## 2022-09-25 MED ORDER — ATORVASTATIN CALCIUM 40 MG PO TABS: 40.0000 mg | ORAL_TABLET | Freq: Every day | ORAL | 3 refills | Status: DC

## 2022-09-25 NOTE — Progress Notes (Signed)
OFFICE VISIT  09/25/2022  CC:  Chief Complaint  Patient presents with   Chest Pain    Upper right side x 1 mo ; feels muscular but unsure and is getting progressively worse over the last week.     Patient is a 62 y.o. male who presents for right sided chest pain.  HPI: About 1 month history of pain in the right pectoral region.  Started when he was playing golf.  He notes it is a bit worse when he is walking on the golf course and when he swings his club.  No left sided chest pain.  No neck pain or radiating arm pain or paresthesias.  No nausea, diaphoresis, palpitations, dizziness, or shortness of breath.   Past Medical History:  Diagnosis Date   Adenomatous colon polyp 12/2017   sessile serrated polyp at appendiceal orifice, not completely removed at endoscopy.  Pt had extended lap appendectomy to make sure entire polyp removed..   Allergy    Carpal tunnel syndrome on both sides    Diverticulosis    Elevated PSA    Repeat 1 mo later was back down.  No bx.  No urology referral.   Family history of prostate cancer    Father   GERD (gastroesophageal reflux disease)    History of colitis    Lymphocytic colitis; episodic flares (most recent approx 2015)   Hyperlipidemia    borderline--controlled with TLC.  2018 [facility]ham 10 yr CV risk = 8.1%: atorva started.   Hypertension    Insomnia    Kidney laceration    Hx of, 1980   Organic impotence    Osteoarthritis, multiple sites    neck/knees.  L knee tricompartmental bone on bone.  R knee mild to moderate osteoarthritis (Dr. French Ana)   Polyarthritis    ?inflamm-->Dr. Amil Amen 2023, allopurinol and plaquenil started   Prediabetes    A1c consistently <6% until 03/2020->A1c 6.0%   Splenic laceration    Hx of, 1980    Past Surgical History:  Procedure Laterality Date   BILATERAL CARPAL TUNNEL RELEASE Bilateral    CERVICAL FUSION  approx 1999   C5-6   COLONOSCOPY  12/2006; 01/2009; 12/2017   [place] in  [place], [place]--Dr. Ann Lions:  2008 (rectal bleeding)-Nonadenomatous polyp: rpt 10 yrs rec'd.  2010: RLQ pain and diarrhea--lymphocytic colitis.  Ileal polyp-benign, diverticulosis. 01/19/18 1 sess serr polyp at appendiceal orifice--incompletely removed.  GI referred him to gen surg for extended appendectomy.  06/2021 NO POLYPS. recall 5 yrs   KNEE SURGERY     Ex baseball player.  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     calcium carbonate (TUMS - DOSED IN MG ELEMENTAL CALCIUM) 500 MG chewable tablet Chew 2-3 tablets by mouth daily as needed for indigestion or heartburn.     hydroxychloroquine (PLAQUENIL) 200 MG tablet Take 200 mg by mouth 2 (two) times daily.  lisinopril (ZESTRIL) 10 MG tablet TAKE 1 TABLET BY MOUTH EVERY DAY 90 tablet 3   LORazepam (ATIVAN) 0.5 MG tablet Take 1 tablet (0.5 mg total) by mouth daily as needed. 30 tablet 0   meloxicam (MOBIC) 7.5 MG tablet Take 7.5 mg by mouth 2 (two) times daily.     Multiple Vitamin (MULTIVITAMIN WITH MINERALS) TABS tablet Take 1 tablet by mouth daily.     atorvastatin (LIPITOR) 40 MG tablet TAKE 1 TABLET BY MOUTH EVERY DAY 90 tablet 0   celecoxib (CELEBREX) 200 MG capsule TAKE 1 CAPSULE BY MOUTH ONCE A DAY AS NEEDED FOR PAIN WITH FOOD (Patient not taking: Reported on 05/27/2022)     gabapentin (NEURONTIN) 300 MG capsule Take 300 mg by mouth at bedtime. (Patient  not taking: Reported on 05/27/2022)     PENNSAID 2 % SOLN SMARTSIG:1 Pump Topical Twice Daily PRN (Patient not taking: Reported on 06/18/2021)     No facility-administered medications prior to visit.    No Known Allergies  Review of Systems  As per HPI  PE:    09/25/2022    2:06 PM 06/24/2022   11:06 AM 05/27/2022    9:36 AM  Vitals with BMI  Height '6\' 0"'$  '6\' 0"'$  '5\' 11"'$   Weight 197 lbs 198 lbs 6 oz 189 lbs 13 oz  BMI 26.71 [id number]  Systolic [id number]  Diastolic 65 78 86  Pulse 83 83 75  O2 sat 96% RA today  Physical Exam  Gen: Alert, well appearing.  Patient is oriented to person, place, time, and situation. AFFECT: pleasant, lucid thought and speech. Cardiovascular: Regular rhythm and rate without murmur. Chest is clear, no wheezing or rales. Normal symmetric air entry throughout both lung fields. No chest wall deformities. He has mild tenderness to palpation of the right pectoral region extending over the tendon of the pectoralis major. Reproduction of his pain with resistance to arm flexion and adduction. No tenderness over the biceps tendon, acromion, or AC joint.  No tenderness over the sternum. No erythema, warmth, or rash.  LABS:  Last CBC Lab Results  Component Value Date   WBC 5.9 05/31/2022   HGB 15.4 05/31/2022   HCT 46.2 05/31/2022   MCV 90.7 05/31/2022   MCH 29.6 06/14/2020   RDW 14.7 05/31/2022   PLT 234.0 [id number]   Last metabolic panel Lab Results  Component Value Date   GLUCOSE 107 (H) 05/31/2022   NA 139 05/31/2022   K 5.0 05/31/2022   CL 104 05/31/2022   CO2 30 05/31/2022   BUN 12 05/31/2022   CREATININE 0.83 05/31/2022   GFRNONAA >60 06/14/2020   CALCIUM 9.9 05/31/2022   PROT 6.7 05/27/2022   ALBUMIN 4.3 05/27/2022   BILITOT 0.8 05/27/2022   ALKPHOS 72 05/27/2022   AST 18 05/27/2022   ALT 20 05/27/2022   ANIONGAP 4 (L) 06/14/2020   Lab Results  Component Value Date   CHOL 139 05/27/2022   HDL 56.10 05/27/2022   LDLCALC  58 05/27/2022   LDLDIRECT 110.0 05/09/2017   TRIG 121.0 05/27/2022   CHOLHDL 2 05/27/2022    IMPRESSION AND PLAN:  Right-sided chest wall pain. Reassured. We discussed the role of formal physical therapy but he prefers to do some home stretching and heating pad use instead. He is on meloxicam 7.5 mg twice daily started by his orthopedist recently (for polyarthritis, esp knees).  An After Visit Summary was printed and given to the patient.  FOLLOW UP: Return if  symptoms worsen or fail to improve.  Signed:  Crissie Sickles, MD           09/25/2022

## 2022-12-11 DIAGNOSIS — M1A09X0 Idiopathic chronic gout, multiple sites, without tophus (tophi): Secondary | ICD-10-CM | POA: Diagnosis not present

## 2022-12-11 DIAGNOSIS — M1991 Primary osteoarthritis, unspecified site: Secondary | ICD-10-CM | POA: Diagnosis not present

## 2022-12-11 DIAGNOSIS — M5136 Other intervertebral disc degeneration, lumbar region: Secondary | ICD-10-CM | POA: Diagnosis not present

## 2022-12-11 DIAGNOSIS — M064 Inflammatory polyarthropathy: Secondary | ICD-10-CM | POA: Diagnosis not present

## 2023-01-29 DIAGNOSIS — M5136 Other intervertebral disc degeneration, lumbar region: Secondary | ICD-10-CM | POA: Diagnosis not present

## 2023-01-29 DIAGNOSIS — M064 Inflammatory polyarthropathy: Secondary | ICD-10-CM | POA: Diagnosis not present

## 2023-01-29 DIAGNOSIS — M6281 Muscle weakness (generalized): Secondary | ICD-10-CM | POA: Diagnosis not present

## 2023-01-29 DIAGNOSIS — M1A09X0 Idiopathic chronic gout, multiple sites, without tophus (tophi): Secondary | ICD-10-CM | POA: Diagnosis not present

## 2023-01-29 DIAGNOSIS — M1991 Primary osteoarthritis, unspecified site: Secondary | ICD-10-CM | POA: Diagnosis not present

## 2023-05-12 DIAGNOSIS — M064 Inflammatory polyarthropathy: Secondary | ICD-10-CM | POA: Diagnosis not present

## 2023-05-12 DIAGNOSIS — M1A09X0 Idiopathic chronic gout, multiple sites, without tophus (tophi): Secondary | ICD-10-CM | POA: Diagnosis not present

## 2023-05-12 DIAGNOSIS — M0609 Rheumatoid arthritis without rheumatoid factor, multiple sites: Secondary | ICD-10-CM | POA: Diagnosis not present

## 2023-05-12 DIAGNOSIS — M1991 Primary osteoarthritis, unspecified site: Secondary | ICD-10-CM | POA: Diagnosis not present

## 2023-05-12 DIAGNOSIS — M542 Cervicalgia: Secondary | ICD-10-CM | POA: Diagnosis not present

## 2023-05-12 DIAGNOSIS — M6281 Muscle weakness (generalized): Secondary | ICD-10-CM | POA: Diagnosis not present

## 2023-05-12 DIAGNOSIS — M5136 Other intervertebral disc degeneration, lumbar region with discogenic back pain only: Secondary | ICD-10-CM | POA: Diagnosis not present

## 2023-05-28 ENCOUNTER — Other Ambulatory Visit: Payer: Self-pay | Admitting: Family Medicine

## 2023-05-28 NOTE — Telephone Encounter (Signed)
Pt has appt Friday, 11/1.

## 2023-05-30 ENCOUNTER — Ambulatory Visit: Payer: Medicare Other | Admitting: Family Medicine

## 2023-05-30 ENCOUNTER — Encounter: Payer: Self-pay | Admitting: Family Medicine

## 2023-05-30 VITALS — BP 127/85 | HR 81 | Ht 71.0 in | Wt 192.0 lb

## 2023-05-30 DIAGNOSIS — Z0000 Encounter for general adult medical examination without abnormal findings: Secondary | ICD-10-CM

## 2023-05-30 DIAGNOSIS — Z23 Encounter for immunization: Secondary | ICD-10-CM

## 2023-05-30 DIAGNOSIS — F418 Other specified anxiety disorders: Secondary | ICD-10-CM | POA: Diagnosis not present

## 2023-05-30 DIAGNOSIS — I10 Essential (primary) hypertension: Secondary | ICD-10-CM | POA: Diagnosis not present

## 2023-05-30 DIAGNOSIS — Z79899 Other long term (current) drug therapy: Secondary | ICD-10-CM

## 2023-05-30 DIAGNOSIS — R7303 Prediabetes: Secondary | ICD-10-CM

## 2023-05-30 DIAGNOSIS — E7800 Pure hypercholesterolemia, unspecified: Secondary | ICD-10-CM | POA: Diagnosis not present

## 2023-05-30 DIAGNOSIS — Z125 Encounter for screening for malignant neoplasm of prostate: Secondary | ICD-10-CM

## 2023-05-30 LAB — CBC WITH DIFFERENTIAL/PLATELET
Basophils Absolute: 0 10*3/uL (ref 0.0–0.1)
Basophils Relative: 0.3 % (ref 0.0–3.0)
Eosinophils Absolute: 0.2 10*3/uL (ref 0.0–0.7)
Eosinophils Relative: 2.3 % (ref 0.0–5.0)
HCT: 48.6 % (ref 39.0–52.0)
Hemoglobin: 15.7 g/dL (ref 13.0–17.0)
Lymphocytes Relative: 30.8 % (ref 12.0–46.0)
Lymphs Abs: 2.5 10*3/uL (ref 0.7–4.0)
MCHC: 32.4 g/dL (ref 30.0–36.0)
MCV: 95.1 fL (ref 78.0–100.0)
Monocytes Absolute: 0.7 10*3/uL (ref 0.1–1.0)
Monocytes Relative: 8.2 % (ref 3.0–12.0)
Neutro Abs: 4.8 10*3/uL (ref 1.4–7.7)
Neutrophils Relative %: 58.4 % (ref 43.0–77.0)
Platelets: 251 10*3/uL (ref 150.0–400.0)
RBC: 5.11 Mil/uL (ref 4.22–5.81)
RDW: 13.9 % (ref 11.5–15.5)
WBC: 8.2 10*3/uL (ref 4.0–10.5)

## 2023-05-30 LAB — COMPREHENSIVE METABOLIC PANEL
ALT: 51 U/L (ref 0–53)
AST: 31 U/L (ref 0–37)
Albumin: 4.5 g/dL (ref 3.5–5.2)
Alkaline Phosphatase: 74 U/L (ref 39–117)
BUN: 16 mg/dL (ref 6–23)
CO2: 29 meq/L (ref 19–32)
Calcium: 10.2 mg/dL (ref 8.4–10.5)
Chloride: 103 meq/L (ref 96–112)
Creatinine, Ser: 0.85 mg/dL (ref 0.40–1.50)
GFR: 92.42 mL/min (ref >60.00)
Glucose, Bld: 93 mg/dL (ref 70–99)
Potassium: 4.9 meq/L (ref 3.5–5.1)
Sodium: 139 meq/L (ref 135–145)
Total Bilirubin: 1.1 mg/dL (ref 0.2–1.2)
Total Protein: 7.1 g/dL (ref 6.0–8.3)

## 2023-05-30 LAB — HEMOGLOBIN A1C: Hgb A1c MFr Bld: 5.6 % (ref 4.6–6.5)

## 2023-05-30 LAB — PSA, MEDICARE: PSA: 2.6 ng/mL (ref 0.10–4.00)

## 2023-05-30 LAB — LIPID PANEL
Cholesterol: 133 mg/dL (ref 0–200)
HDL: 62.3 mg/dL (ref >39.00)
LDL Cholesterol: 51 mg/dL (ref 0–99)
NonHDL: 71
Total CHOL/HDL Ratio: 2
Triglycerides: 99 mg/dL (ref 0.0–149.0)
VLDL: 19.8 mg/dL (ref 0.0–40.0)

## 2023-05-30 MED ORDER — LISINOPRIL 10 MG PO TABS: 10.0000 mg | ORAL_TABLET | Freq: Every day | ORAL | 3 refills | Status: DC

## 2023-05-30 MED ORDER — ATORVASTATIN CALCIUM 40 MG PO TABS: 40.0000 mg | ORAL_TABLET | Freq: Every day | ORAL | 3 refills | Status: AC

## 2023-05-30 NOTE — Patient Instructions (Signed)
Health Maintenance, Male Adopting a healthy lifestyle and getting preventive care are important in promoting health and wellness. Ask your health care provider about: The right schedule for you to have regular tests and exams. Things you can do on your own to prevent diseases and keep yourself healthy. What should I know about diet, weight, and exercise? Eat a healthy diet  Eat a diet that includes plenty of vegetables, fruits, low-fat dairy products, and lean protein. Do not eat a lot of foods that are high in solid fats, added sugars, or sodium. Maintain a healthy weight Body mass index (BMI) is a measurement that can be used to identify possible weight problems. It estimates body fat based on height and weight. Your health care provider can help determine your BMI and help you achieve or maintain a healthy weight. Get regular exercise Get regular exercise. This is one of the most important things you can do for your health. Most adults should: Exercise for at least 150 minutes each week. The exercise should increase your heart rate and make you sweat (moderate-intensity exercise). Do strengthening exercises at least twice a week. This is in addition to the moderate-intensity exercise. Spend less time sitting. Even light physical activity can be beneficial. Watch cholesterol and blood lipids Have your blood tested for lipids and cholesterol at 63 years of age, then have this test every 5 years. You may need to have your cholesterol levels checked more often if: Your lipid or cholesterol levels are high. You are older than 63 years of age. You are at high risk for heart disease. What should I know about cancer screening? Many types of cancers can be detected early and may often be prevented. Depending on your health history and family history, you may need to have cancer screening at various ages. This may include screening for: Colorectal cancer. Prostate cancer. Skin cancer. Lung  cancer. What should I know about heart disease, diabetes, and high blood pressure? Blood pressure and heart disease High blood pressure causes heart disease and increases the risk of stroke. This is more likely to develop in people who have high blood pressure readings or are overweight. Talk with your health care provider about your target blood pressure readings. Have your blood pressure checked: Every 3-5 years if you are 18-39 years of age. Every year if you are 40 years old or older. If you are between the ages of 65 and 75 and are a current or former smoker, ask your health care provider if you should have a one-time screening for abdominal aortic aneurysm (AAA). Diabetes Have regular diabetes screenings. This checks your fasting blood sugar level. Have the screening done: Once every three years after age 45 if you are at a normal weight and have a low risk for diabetes. More often and at a younger age if you are overweight or have a high risk for diabetes. What should I know about preventing infection? Hepatitis B If you have a higher risk for hepatitis B, you should be screened for this virus. Talk with your health care provider to find out if you are at risk for hepatitis B infection. Hepatitis C Blood testing is recommended for: Everyone born from 1945 through 1965. Anyone with known risk factors for hepatitis C. Sexually transmitted infections (STIs) You should be screened each year for STIs, including gonorrhea and chlamydia, if: You are sexually active and are younger than 63 years of age. You are older than 63 years of age and your   health care provider tells you that you are at risk for this type of infection. Your sexual activity has changed since you were last screened, and you are at increased risk for chlamydia or gonorrhea. Ask your health care provider if you are at risk. Ask your health care provider about whether you are at high risk for HIV. Your health care provider  may recommend a prescription medicine to help prevent HIV infection. If you choose to take medicine to prevent HIV, you should first get tested for HIV. You should then be tested every 3 months for as long as you are taking the medicine. Follow these instructions at home: Alcohol use Do not drink alcohol if your health care provider tells you not to drink. If you drink alcohol: Limit how much you have to 0-2 drinks a day. Know how much alcohol is in your drink. In the [place] one drink equals one 12 oz bottle of beer (355 mL), one 5 oz glass of wine (148 mL), or one 1 oz glass of hard liquor (44 mL). Lifestyle Do not use any products that contain nicotine or tobacco. These products include cigarettes, chewing tobacco, and vaping devices, such as e-cigarettes. If you need help quitting, ask your health care provider. Do not use street drugs. Do not share needles. Ask your health care provider for help if you need support or information about quitting drugs. General instructions Schedule regular health, dental, and eye exams. Stay current with your vaccines. Tell your health care provider if: You often feel depressed. You have ever been abused or do not feel safe at home. Summary Adopting a healthy lifestyle and getting preventive care are important in promoting health and wellness. Follow your health care provider's instructions about healthy diet, exercising, and getting tested or screened for diseases. Follow your health care provider's instructions on monitoring your cholesterol and blood pressure. This information is not intended to replace advice given to you by your health care provider. Make sure you discuss any questions you have with your health care provider. Document Revised: 12/04/2020 Document Reviewed: 12/04/2020 Elsevier Patient Education  2024 Elsevier Inc.  

## 2023-05-30 NOTE — Progress Notes (Signed)
Office Note 05/30/2023  CC:  Chief Complaint  Patient presents with   Annual Exam    Pt is fasting.    Patient is a 62 y.o. male who is here for annual health maintenance exam and follow-up hypertension hypercholesterolemia, episodic anxiety, and prediabetes. A/P as of last visit: "1) hypertension, well controlled on lisinopril 10 mg a day.   2 episodic anxiety and anxiety-related insomnia. He uses small amounts of lorazepam infrequently. #30 of the 0.5 mg lorazepam tabs prescribed today.  No refill.   #3 hypercholesterolemia.  Doing well on atorvastatin 40 mg a day. Lipid panel and hepatic panel today.   #4 prediabetes. Fasting glucose today. Hemoglobin A1c today".  INTERIM HX: Billy Bates is doing fine other than chronic joint pain: Both knees, both hands, neck.  He has been diagnosed with an inflammatory arthritis, followed by Dr. Dierdre Forth in rheumatology. Methotrexate resulted in oral ulcers.  Trial of Plaquenil did not help. Currently he is on meloxicam 15 mg daily and this helps a mild to moderate amount. He says they have plans to start Enbrel very soon.   PMP AWARE reviewed today: most recent rx for lorazepam 0.5 mg was filled 05/27/2022, # 30, rx by me. No red flags.  Past Medical History:  Diagnosis Date   Adenomatous colon polyp 12/2017   sessile serrated polyp at appendiceal orifice, not completely removed at endoscopy.  Pt had extended lap appendectomy to make sure entire polyp removed..   Allergy    Carpal tunnel syndrome on both sides    Diverticulosis    Elevated PSA    Repeat 1 mo later was back down.  No bx.  No urology referral.   Family history of prostate cancer    Father   GERD (gastroesophageal reflux disease)    History of colitis    Lymphocytic colitis; episodic flares (most recent approx 2015)   Hyperlipidemia    borderline--controlled with TLC.  2018 [facility] 10 yr CV risk = 8.1%: atorva started.   Hypertension    Insomnia    Kidney  laceration    Hx of, 1980   Organic impotence    Osteoarthritis, multiple sites    neck/knees.  L knee tricompartmental bone on bone.  R knee mild to moderate osteoarthritis (Dr. Madelon Lips)   Polyarthritis    ?inflamm-->Dr. Dierdre Forth 2023, allopurinol and plaquenil started   Prediabetes    A1c consistently <6% until 03/2020->A1c 6.0%   Splenic laceration    Hx of, 1980    Past Surgical History:  Procedure Laterality Date   BILATERAL CARPAL TUNNEL RELEASE Bilateral    CERVICAL FUSION  approx 1999   C5-6   COLONOSCOPY  12/2006; 01/2009; 12/2017   [facility] in [place], [place]--Dr. Blanche East:  2008 (rectal bleeding)-Nonadenomatous polyp: rpt 10 yrs rec'd.  2010: RLQ pain and diarrhea--lymphocytic colitis.  Ileal polyp-benign, diverticulosis. 01/19/18 1 sess serr polyp at appendiceal orifice--incompletely removed.  GI referred him to gen surg for extended appendectomy.  06/2021 NO POLYPS. recall 5 yrs   KNEE SURGERY     Ex baseball player.  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)   LAPAROSCOPIC APPENDECTOMY N/A 03/23/2018   Procedure: LAPAROSCOPIC APPENDECTOMY;  Surgeon: Violeta Gelinas, MD;  Location: [facility] OR;  Service: General;  Laterality: N/A;   POLYPECTOMY     TONSILLECTOMY AND ADENOIDECTOMY  1968   TOTAL KNEE ARTHROPLASTY Right    TOTAL KNEE REVISION Left 06/27/2020   Procedure: LEFT TOTAL KNEE REVISION (POLY AND TIBIA);  Surgeon: Jerl Santos,  Theron Arista, MD;  Location: [facility];  Service: Orthopedics;  Laterality: Left;x 2    Family History  Problem Relation Age of Onset   Breast cancer Mother    Heart disease Mother    Colon polyps Father    Arthritis Father    Prostate cancer Father    Stroke Father    Hypertension Father    Colon cancer Neg Hx    Rectal cancer Neg Hx    Stomach cancer Neg Hx    Esophageal cancer Neg Hx     Social History   Socioeconomic History   Marital status: Married    Spouse name: Not on file   Number of children: Not on file   Years of  education: Not on file   Highest education level: Bachelor's degree (e.g., BA, AB, BS)  Occupational History   Not on file  Tobacco Use   Smoking status: Never   Smokeless tobacco: Former    Types: Engineer, drilling   Vaping status: Never Used  Substance and Sexual Activity   Alcohol use: Yes    Comment: recreational    Drug use: No   Sexual activity: Yes  Other Topics Concern   Not on file  Social History Narrative   Married, 2 daughters.   Educ: BS   Occupation: Production designer, theatre/television/film for [employer]      No Tob or drugs.  Occ alcohol.   Social Determinants of Health   Financial Resource Strain: Patient Declined (05/30/2023)   Overall Financial Resource Strain (CARDIA)    Difficulty of Paying Living Expenses: Patient declined  Food Insecurity: Patient Declined (05/30/2023)   Hunger Vital Sign    Worried About Running Out of Food in the Last Year: Patient declined    Ran Out of Food in the Last Year: Patient declined  Transportation Needs: No Transportation Needs (05/30/2023)   PRAPARE - Administrator, Civil Service (Medical): No    Lack of Transportation (Non-Medical): No  Physical Activity: Sufficiently Active (05/30/2023)   Exercise Vital Sign    Days of Exercise per Week: 7 days    Minutes of Exercise per Session: 50 min  Stress: No Stress Concern Present (05/30/2023)   [employer] of Occupational Health - Occupational Stress Questionnaire    Feeling of Stress : Not at all  Social Connections: Unknown (05/30/2023)   Social Connection and Isolation Panel [NHANES]    Frequency of Communication with Friends and Family: Patient declined    Frequency of Social Gatherings with Friends and Family: Patient declined    Attends Religious Services: Patient declined    Database administrator or Organizations: Patient declined    Attends Engineer, structural: Not on file    Marital Status: Married  Catering manager Violence: Not on file    Outpatient Medications Prior  to Visit  Medication Sig Dispense Refill   allopurinol (ZYLOPRIM) 300 MG tablet Take 300 mg by mouth daily.     calcium carbonate (TUMS - DOSED IN MG ELEMENTAL CALCIUM) 500 MG chewable tablet Chew 2-3 tablets by mouth daily as needed for indigestion or heartburn.     LORazepam (ATIVAN) 0.5 MG tablet Take 1 tablet (0.5 mg total) by mouth daily as needed. 30 tablet 0   meloxicam (MOBIC) 7.5 MG tablet Take 7.5 mg by mouth 2 (two) times daily.     Multiple Vitamin (MULTIVITAMIN WITH MINERALS) TABS tablet Take 1 tablet by mouth daily.     atorvastatin (LIPITOR) 40 MG  tablet Take 1 tablet (40 mg total) by mouth daily. 90 tablet 3   hydroxychloroquine (PLAQUENIL) 200 MG tablet Take 200 mg by mouth 2 (two) times daily.     lisinopril (ZESTRIL) 10 MG tablet TAKE 1 TABLET BY MOUTH EVERY DAY 90 tablet 3   No facility-administered medications prior to visit.    No Known Allergies  Review of Systems  Constitutional:  Negative for appetite change, chills, fatigue and fever.  HENT:  Negative for congestion, dental problem, ear pain and sore throat.   Eyes:  Negative for discharge, redness and visual disturbance.  Respiratory:  Negative for cough, chest tightness, shortness of breath and wheezing.   Cardiovascular:  Negative for chest pain, palpitations and leg swelling.  Gastrointestinal:  Negative for abdominal pain, blood in stool, diarrhea, nausea and vomiting.  Genitourinary:  Negative for difficulty urinating, dysuria, flank pain, frequency, hematuria and urgency.  Musculoskeletal:  Positive for arthralgias (knees,hands/fingers). Negative for back pain, joint swelling, myalgias and neck stiffness.  Skin:  Negative for pallor and rash.  Neurological:  Negative for dizziness, speech difficulty, weakness and headaches.  Hematological:  Negative for adenopathy. Does not bruise/bleed easily.  Psychiatric/Behavioral:  Negative for confusion and sleep disturbance. The patient is not nervous/anxious.      PE;    05/30/2023   10:38 AM 09/25/2022    2:06 PM 06/24/2022   11:06 AM  Vitals with BMI  Height 5\' 11"  6\' 0"  6\' 0"   Weight 192 lbs 197 lbs 198 lbs 6 oz  BMI 26.79 26.71 26.9  Systolic 127 100 161  Diastolic 85 65 78  Pulse 81 83 83    Gen: Alert, well appearing.  Patient is oriented to person, place, time, and situation. AFFECT: pleasant, lucid thought and speech. ENT: Ears: EACs clear, normal epithelium.  TMs with good light reflex and landmarks bilaterally.  Eyes: no injection, icteris, swelling, or exudate.  EOMI, PERRLA. Nose: no drainage or turbinate edema/swelling.  No injection or focal lesion.  Mouth: lips without lesion/swelling.  Oral mucosa pink and moist.  Dentition intact and without obvious caries or gingival swelling.  Oropharynx without erythema, exudate, or swelling.  Neck: supple/nontender.  No LAD, mass, or TM.  Carotid pulses 2+ bilaterally, without bruits. CV: RRR, no m/r/g.   LUNGS: CTA bilat, nonlabored resps, good aeration in all lung fields. ABD: soft, NT, ND, BS normal.  No hepatospenomegaly or mass.  No bruits. EXT: no clubbing, cyanosis, or edema.  Musculoskeletal: no joint swelling, erythema, warmth, or tenderness.  ROM of all joints intact. Skin - no sores or suspicious lesions or rashes or color changes  Pertinent labs:  Lab Results  Component Value Date   TSH 1.80 12/20/2020   Lab Results  Component Value Date   WBC 5.9 05/31/2022   HGB 15.4 05/31/2022   HCT 46.2 05/31/2022   MCV 90.7 05/31/2022   PLT 234.0 05/31/2022   Lab Results  Component Value Date   CREATININE 0.83 05/31/2022   BUN 12 05/31/2022   NA 139 05/31/2022   K 5.0 05/31/2022   CL 104 05/31/2022   CO2 30 05/31/2022   Lab Results  Component Value Date   ALT 20 05/27/2022   AST 18 05/27/2022   ALKPHOS 72 05/27/2022   BILITOT 0.8 05/27/2022   Lab Results  Component Value Date   CHOL 139 05/27/2022   Lab Results  Component Value Date   HDL 56.10 05/27/2022    Lab Results  Component Value Date  LDLCALC 58 05/27/2022   Lab Results  Component Value Date   TRIG 121.0 05/27/2022   Lab Results  Component Value Date   CHOLHDL 2 05/27/2022   Lab Results  Component Value Date   PSA 1.79 05/27/2022   PSA 1.81 12/20/2020   PSA 2.72 02/23/2019   Lab Results  Component Value Date   HGBA1C 5.9 05/27/2022   ASSESSMENT AND PLAN:   #1 health maintenance exam: Reviewed age and gender appropriate health maintenance issues (prudent diet, regular exercise, health risks of tobacco and excessive alcohol, use of seatbelts, fire alarms in home, use of sunscreen).  Also reviewed age and gender appropriate health screening as well as vaccine recommendations. Vaccines: Flu->given today. otherwise up-to-date Labs: CBC, CMet, lipid panel, hemoglobin A1c, PSA Prostate ca screening: PSA today Colon ca screening: History of polyps.  07/16/2021 colonoscopy did not show any polyps, though.  2) hypertension, well controlled on lisinopril 10 mg a day. Electrolytes and creatinine monitoring today.  3) episodic anxiety and anxiety-related insomnia. He uses small amounts of lorazepam very infrequently. No refill was needed today.   4) hypercholesterolemia.  Doing well on atorvastatin 40 mg a day. Lipid panel and hepatic panel today.   5) prediabetes. Fasting glucose today. Hemoglobin A1c today  An After Visit Summary was printed and given to the patient.  FOLLOW UP:  Return in about 6 months (around 11/27/2023) for routine chronic illness f/u.  Signed:  Santiago Bumpers, MD           05/30/2023

## 2023-06-04 ENCOUNTER — Other Ambulatory Visit: Payer: Self-pay

## 2023-06-04 MED ORDER — LISINOPRIL 10 MG PO TABS: 10.0000 mg | ORAL_TABLET | Freq: Every day | ORAL | 3 refills | Status: DC

## 2023-06-04 NOTE — Telephone Encounter (Signed)
Patient request to have medication filled thru mail order pharmacy. Medication resent to [facility]

## 2023-08-20 DIAGNOSIS — M1991 Primary osteoarthritis, unspecified site: Secondary | ICD-10-CM | POA: Diagnosis not present

## 2023-08-20 DIAGNOSIS — M542 Cervicalgia: Secondary | ICD-10-CM | POA: Diagnosis not present

## 2023-08-20 DIAGNOSIS — M6281 Muscle weakness (generalized): Secondary | ICD-10-CM | POA: Diagnosis not present

## 2023-08-20 DIAGNOSIS — M5136 Other intervertebral disc degeneration, lumbar region with discogenic back pain only: Secondary | ICD-10-CM | POA: Diagnosis not present

## 2023-08-20 DIAGNOSIS — M064 Inflammatory polyarthropathy: Secondary | ICD-10-CM | POA: Diagnosis not present

## 2023-08-20 DIAGNOSIS — M0609 Rheumatoid arthritis without rheumatoid factor, multiple sites: Secondary | ICD-10-CM | POA: Diagnosis not present

## 2023-08-20 DIAGNOSIS — M1A09X0 Idiopathic chronic gout, multiple sites, without tophus (tophi): Secondary | ICD-10-CM | POA: Diagnosis not present

## 2023-09-10 ENCOUNTER — Other Ambulatory Visit: Payer: Self-pay | Admitting: Family Medicine

## 2023-11-05 DIAGNOSIS — M1611 Unilateral primary osteoarthritis, right hip: Secondary | ICD-10-CM | POA: Diagnosis not present

## 2023-11-05 DIAGNOSIS — M064 Inflammatory polyarthropathy: Secondary | ICD-10-CM | POA: Diagnosis not present

## 2023-11-05 DIAGNOSIS — M25551 Pain in right hip: Secondary | ICD-10-CM | POA: Diagnosis not present

## 2023-11-05 DIAGNOSIS — M0609 Rheumatoid arthritis without rheumatoid factor, multiple sites: Secondary | ICD-10-CM | POA: Diagnosis not present

## 2023-11-05 DIAGNOSIS — M6281 Muscle weakness (generalized): Secondary | ICD-10-CM | POA: Diagnosis not present

## 2023-11-05 DIAGNOSIS — M5136 Other intervertebral disc degeneration, lumbar region with discogenic back pain only: Secondary | ICD-10-CM | POA: Diagnosis not present

## 2023-11-05 DIAGNOSIS — M1A09X0 Idiopathic chronic gout, multiple sites, without tophus (tophi): Secondary | ICD-10-CM | POA: Diagnosis not present

## 2023-11-05 DIAGNOSIS — M542 Cervicalgia: Secondary | ICD-10-CM | POA: Diagnosis not present

## 2023-11-05 DIAGNOSIS — M1991 Primary osteoarthritis, unspecified site: Secondary | ICD-10-CM | POA: Diagnosis not present

## 2023-11-10 DIAGNOSIS — M1611 Unilateral primary osteoarthritis, right hip: Secondary | ICD-10-CM | POA: Diagnosis not present

## 2023-12-03 ENCOUNTER — Ambulatory Visit: Payer: Medicare Other | Admitting: Family Medicine

## 2024-03-31 DIAGNOSIS — M1611 Unilateral primary osteoarthritis, right hip: Secondary | ICD-10-CM | POA: Diagnosis not present

## 2024-05-05 DIAGNOSIS — M25551 Pain in right hip: Secondary | ICD-10-CM | POA: Diagnosis not present

## 2024-05-05 DIAGNOSIS — M1A09X0 Idiopathic chronic gout, multiple sites, without tophus (tophi): Secondary | ICD-10-CM | POA: Diagnosis not present

## 2024-05-05 DIAGNOSIS — M1991 Primary osteoarthritis, unspecified site: Secondary | ICD-10-CM | POA: Diagnosis not present

## 2024-05-05 DIAGNOSIS — M542 Cervicalgia: Secondary | ICD-10-CM | POA: Diagnosis not present

## 2024-05-05 DIAGNOSIS — M5136 Other intervertebral disc degeneration, lumbar region with discogenic back pain only: Secondary | ICD-10-CM | POA: Diagnosis not present

## 2024-05-05 DIAGNOSIS — M6281 Muscle weakness (generalized): Secondary | ICD-10-CM | POA: Diagnosis not present

## 2024-05-05 DIAGNOSIS — M064 Inflammatory polyarthropathy: Secondary | ICD-10-CM | POA: Diagnosis not present

## 2024-05-05 DIAGNOSIS — M0609 Rheumatoid arthritis without rheumatoid factor, multiple sites: Secondary | ICD-10-CM | POA: Diagnosis not present

## 2024-05-07 DIAGNOSIS — S76311A Strain of muscle, fascia and tendon of the posterior muscle group at thigh level, right thigh, initial encounter: Secondary | ICD-10-CM | POA: Diagnosis not present

## 2024-05-21 ENCOUNTER — Other Ambulatory Visit: Payer: Self-pay

## 2024-05-21 ENCOUNTER — Other Ambulatory Visit: Payer: Self-pay | Admitting: Family Medicine

## 2024-05-21 ENCOUNTER — Telehealth: Payer: Self-pay

## 2024-05-21 DIAGNOSIS — E7800 Pure hypercholesterolemia, unspecified: Secondary | ICD-10-CM

## 2024-05-21 DIAGNOSIS — I10 Essential (primary) hypertension: Secondary | ICD-10-CM

## 2024-05-21 DIAGNOSIS — Z125 Encounter for screening for malignant neoplasm of prostate: Secondary | ICD-10-CM

## 2024-05-21 DIAGNOSIS — R7303 Prediabetes: Secondary | ICD-10-CM

## 2024-05-21 MED ORDER — LISINOPRIL 10 MG PO TABS: 10.0000 mg | ORAL_TABLET | Freq: Every day | ORAL | 0 refills | Status: DC

## 2024-05-21 NOTE — Addendum Note (Signed)
 Addended by: CANDISE ALEENE DEL on: 05/21/2024 01:24 PM   Modules accepted: Orders

## 2024-05-21 NOTE — Telephone Encounter (Signed)
 Yes, lab appointment is fine.  Orders are in.

## 2024-05-21 NOTE — Telephone Encounter (Signed)
 Primary Information  Source  Billy Bates, Billy Bates (Patient)   Subject  Billy Bates, Billy Bates (Patient)   Topic  Clinical - Request for Lab/Test Order    Communication  Reason for CRM: Pt is requesting fasting labs prior to physical appt on 06/09/24.     Patient Information  Patient Name Gender DOB SSN  Billy Bates, Billy Bates Male [birth year] [id number]   Contacts  Contact Date/Time Type Contact Phone/Fax  05/21/2024 10:27 AM EDT Phone (Incoming) [facility] (Self)    Routing History   From To Priority  05/21/2024 10:37 AM Delores Saunas R P [facility] Routine    Please advise if ok for labs, CPE appt is scheduled for 11/12.

## 2024-06-02 ENCOUNTER — Other Ambulatory Visit (INDEPENDENT_AMBULATORY_CARE_PROVIDER_SITE_OTHER)

## 2024-06-02 DIAGNOSIS — I10 Essential (primary) hypertension: Secondary | ICD-10-CM

## 2024-06-02 DIAGNOSIS — E7800 Pure hypercholesterolemia, unspecified: Secondary | ICD-10-CM | POA: Diagnosis not present

## 2024-06-02 DIAGNOSIS — R7303 Prediabetes: Secondary | ICD-10-CM | POA: Diagnosis not present

## 2024-06-02 DIAGNOSIS — Z125 Encounter for screening for malignant neoplasm of prostate: Secondary | ICD-10-CM

## 2024-06-02 LAB — COMPREHENSIVE METABOLIC PANEL WITH GFR
ALT: 30 U/L (ref 0–53)
AST: 25 U/L (ref 0–37)
Albumin: 4.4 g/dL (ref 3.5–5.2)
Alkaline Phosphatase: 74 U/L (ref 39–117)
BUN: 19 mg/dL (ref 6–23)
CO2: 30 meq/L (ref 19–32)
Calcium: 9.4 mg/dL (ref 8.4–10.5)
Chloride: 104 meq/L (ref 96–112)
Creatinine, Ser: 0.89 mg/dL (ref 0.40–1.50)
GFR: 90.5 mL/min (ref >60.00)
Glucose, Bld: 104 mg/dL — ABNORMAL HIGH (ref 70–99)
Potassium: 5.1 meq/L (ref 3.5–5.1)
Sodium: 140 meq/L (ref 135–145)
Total Bilirubin: 0.9 mg/dL (ref 0.2–1.2)
Total Protein: 6.7 g/dL (ref 6.0–8.3)

## 2024-06-02 LAB — CBC WITH DIFFERENTIAL/PLATELET
Basophils Absolute: 0 K/uL (ref 0.0–0.1)
Basophils Relative: 0.5 % (ref 0.0–3.0)
Eosinophils Absolute: 0.1 K/uL (ref 0.0–0.7)
Eosinophils Relative: 1.8 % (ref 0.0–5.0)
HCT: 47.1 % (ref 39.0–52.0)
Hemoglobin: 15.6 g/dL (ref 13.0–17.0)
Lymphocytes Relative: 37.1 % (ref 12.0–46.0)
Lymphs Abs: 2.4 K/uL (ref 0.7–4.0)
MCHC: 33.1 g/dL (ref 30.0–36.0)
MCV: 93.4 fl (ref 78.0–100.0)
Monocytes Absolute: 0.6 K/uL (ref 0.1–1.0)
Monocytes Relative: 9.7 % (ref 3.0–12.0)
Neutro Abs: 3.3 K/uL (ref 1.4–7.7)
Neutrophils Relative %: 50.9 % (ref 43.0–77.0)
Platelets: 212 K/uL (ref 150.0–400.0)
RBC: 5.04 Mil/uL (ref 4.22–5.81)
RDW: 14.3 % (ref 11.5–15.5)
WBC: 6.5 K/uL (ref 4.0–10.5)

## 2024-06-02 LAB — LIPID PANEL
Cholesterol: 131 mg/dL (ref 0–200)
HDL: 56.7 mg/dL (ref >39.00)
LDL Cholesterol: 58 mg/dL (ref 0–99)
NonHDL: 73.96
Total CHOL/HDL Ratio: 2
Triglycerides: 80 mg/dL (ref 0.0–149.0)
VLDL: 16 mg/dL (ref 0.0–40.0)

## 2024-06-02 LAB — HEMOGLOBIN A1C: Hgb A1c MFr Bld: 5.5 % (ref 4.6–6.5)

## 2024-06-02 LAB — PSA, MEDICARE: PSA: 2.24 ng/mL (ref 0.10–4.00)

## 2024-06-03 ENCOUNTER — Ambulatory Visit: Payer: Self-pay | Admitting: Family Medicine

## 2024-06-04 ENCOUNTER — Encounter: Admitting: Family Medicine

## 2024-06-09 ENCOUNTER — Ambulatory Visit (INDEPENDENT_AMBULATORY_CARE_PROVIDER_SITE_OTHER): Admitting: Family Medicine

## 2024-06-09 VITALS — BP 119/74 | HR 76 | Temp 97.6°F | Ht 71.0 in | Wt 200.8 lb

## 2024-06-09 DIAGNOSIS — I10 Essential (primary) hypertension: Secondary | ICD-10-CM | POA: Diagnosis not present

## 2024-06-09 DIAGNOSIS — Z23 Encounter for immunization: Secondary | ICD-10-CM

## 2024-06-09 DIAGNOSIS — E7800 Pure hypercholesterolemia, unspecified: Secondary | ICD-10-CM | POA: Diagnosis not present

## 2024-06-09 DIAGNOSIS — Z0000 Encounter for general adult medical examination without abnormal findings: Secondary | ICD-10-CM | POA: Diagnosis not present

## 2024-06-09 DIAGNOSIS — R252 Cramp and spasm: Secondary | ICD-10-CM | POA: Diagnosis not present

## 2024-06-09 MED ORDER — LISINOPRIL 10 MG PO TABS: 10.0000 mg | ORAL_TABLET | Freq: Every day | ORAL | 3 refills | Status: AC

## 2024-06-09 NOTE — Progress Notes (Signed)
 Office Note 06/09/2024  CC:  Chief Complaint  Patient presents with   Annual Exam    Pt is not fasting; labs done 11/5    HPI:  Patient is a 63 y.o. male who is here for annual health maintenance exam and follow-up hypertension, hypercholesterolemia, episodic anxiety, and prediabetes.  He has cramps in the hamstring areas and calf areas bilaterally.  Comes in spurts. He has chronic pain and stiffness in his hands and knees, is on Enbrel and meloxicam per rheumatologist. He asks about possible trial off of atorvastatin  to see if his muscle cramps improved.  Rarely takes lorazepam  for anxiety/insomnia. PMP AWARE reviewed today: most recent rx for lorazepam  0.5 mg was filled 09/11/2023, # 30, rx by me. No red flags.   Past Medical History:  Diagnosis Date   Adenomatous colon polyp 12/2017   sessile serrated polyp at appendiceal orifice, not completely removed at endoscopy.  Pt had extended lap appendectomy to make sure entire polyp removed..   Allergy     Carpal tunnel syndrome on both sides    Diverticulosis    Elevated PSA    Repeat 1 mo later was back down.  No bx.  No urology referral.   Family history of prostate cancer    Father   GERD (gastroesophageal reflux disease)    History of colitis    Lymphocytic colitis; episodic flares (most recent approx 2015)   Hyperlipidemia    borderline--controlled with TLC.  2018 [facility] 10 yr CV risk = 8.1%: atorva started.   Hypertension    Insomnia    Kidney laceration    Hx of, 1980   Organic impotence    Osteoarthritis, multiple sites    neck/knees.  L knee tricompartmental bone on bone.  R knee mild to moderate osteoarthritis (Dr. Shari)   Polyarthritis    ?inflamm-->Dr. Mai 2023, allopurinol and plaquenil started   Prediabetes    A1c consistently <6% until 03/2020->A1c 6.0%   Splenic laceration    Hx of, 1980    Past Surgical History:  Procedure Laterality Date   APPENDECTOMY     BILATERAL CARPAL TUNNEL  RELEASE Bilateral    CERVICAL FUSION  approx 1999   C5-6   COLONOSCOPY  12/2006; 01/2009; 12/2017   [facility] in [place], [place]--Dr. Simmie:  2008 (rectal bleeding)-Nonadenomatous polyp: rpt 10 yrs rec'd.  2010: RLQ pain and diarrhea--lymphocytic colitis.  Ileal polyp-benign, diverticulosis. 01/19/18 1 sess serr polyp at appendiceal orifice--incompletely removed.  GI referred him to gen surg for extended appendectomy.  06/2021 NO POLYPS. recall 5 yrs   JOINT REPLACEMENT     KNEE SURGERY     Ex baseball player.  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)   LAPAROSCOPIC APPENDECTOMY N/A 03/23/2018   Procedure: LAPAROSCOPIC APPENDECTOMY;  Surgeon: Sebastian Moles, MD;  Location: [facility] OR;  Service: General;  Laterality: N/A;   POLYPECTOMY     SPINE SURGERY     TONSILLECTOMY AND ADENOIDECTOMY  1968   TOTAL KNEE ARTHROPLASTY Right    TOTAL KNEE REVISION Left 06/27/2020   Procedure: LEFT TOTAL KNEE REVISION (POLY AND TIBIA);  Surgeon: Sheril Coy, MD;  Location: [facility] ORS;  Service: Orthopedics;  Laterality: Left;x 2    Family History  Problem Relation Age of Onset   Breast cancer Mother    Heart disease Mother    Colon polyps Father    Arthritis Father    Prostate cancer Father    Stroke Father    Hypertension Father  Colon cancer Neg Hx    Rectal cancer Neg Hx    Stomach cancer Neg Hx    Esophageal cancer Neg Hx     Social History   Socioeconomic History   Marital status: Married    Spouse name: Not on file   Number of children: Not on file   Years of education: Not on file   Highest education level: Bachelor's degree (e.g., BA, AB, BS)  Occupational History   Not on file  Tobacco Use   Smoking status: Never   Smokeless tobacco: Former    Types: Engineer, Drilling   Vaping status: Never Used  Substance and Sexual Activity   Alcohol use: Yes    Alcohol/week: 5.0 standard drinks of alcohol    Types: 1 Glasses of wine, 4 Cans of beer per week    Comment:  recreational    Drug use: No   Sexual activity: Yes  Other Topics Concern   Not on file  Social History Narrative   Married, 2 daughters.   Educ: BS   Occupation: Production Designer, Theatre/television/film for [employer]      No Tob or drugs.  Occ alcohol.   Social Drivers of Health   Financial Resource Strain: Patient Declined (06/09/2024)   Overall Financial Resource Strain (CARDIA)    Difficulty of Paying Living Expenses: Patient declined  Food Insecurity: Patient Declined (06/09/2024)   Hunger Vital Sign    Worried About Running Out of Food in the Last Year: Patient declined    Ran Out of Food in the Last Year: Patient declined  Transportation Needs: No Transportation Needs (06/09/2024)   PRAPARE - Administrator, Civil Service (Medical): No    Lack of Transportation (Non-Medical): No  Physical Activity: Sufficiently Active (06/09/2024)   Exercise Vital Sign    Days of Exercise per Week: 7 days    Minutes of Exercise per Session: 60 min  Stress: No Stress Concern Present (06/09/2024)   [employer] of Occupational Health - Occupational Stress Questionnaire    Feeling of Stress: Not at all  Social Connections: Unknown (06/09/2024)   Social Connection and Isolation Panel    Frequency of Communication with Friends and Family: More than three times a week    Frequency of Social Gatherings with Friends and Family: Patient declined    Attends Religious Services: Patient declined    Database Administrator or Organizations: Patient declined    Attends Engineer, Structural: Not on file    Marital Status: Married  Catering Manager Violence: Not on file    Outpatient Medications Prior to Visit  Medication Sig Dispense Refill   allopurinol (ZYLOPRIM) 300 MG tablet Take 300 mg by mouth daily.     atorvastatin  (LIPITOR) 40 MG tablet Take 1 tablet (40 mg total) by mouth daily. 90 tablet 3   calcium  carbonate (TUMS - DOSED IN MG ELEMENTAL CALCIUM ) 500 MG chewable tablet Chew 2-3 tablets by  mouth daily as needed for indigestion or heartburn.     ENBREL SURECLICK 50 MG/ML injection Inject 50 mg into the skin once a week.     LORazepam  (ATIVAN ) 0.5 MG tablet TAKE 1 TABLET BY MOUTH DAILY AS NEEDED. 30 tablet 0   meloxicam (MOBIC) 15 MG tablet Take 15 mg by mouth daily.     Multiple Vitamin (MULTIVITAMIN WITH MINERALS) TABS tablet Take 1 tablet by mouth daily.     lisinopril  (ZESTRIL ) 10 MG tablet Take 1 tablet (10 mg total) by mouth  daily. 30 tablet 0   No facility-administered medications prior to visit.    Allergies  Allergen Reactions   Methotrexate And Trimetrexate Other (See Comments)    Oral ulcers    Review of Systems  Constitutional:  Negative for appetite change, chills, fatigue and fever.  HENT:  Negative for congestion, dental problem, ear pain and sore throat.   Eyes:  Negative for discharge, redness and visual disturbance.  Respiratory:  Negative for cough, chest tightness, shortness of breath and wheezing.   Cardiovascular:  Negative for chest pain, palpitations and leg swelling.  Gastrointestinal:  Negative for abdominal pain, blood in stool, diarrhea, nausea and vomiting.  Genitourinary:  Negative for difficulty urinating, dysuria, flank pain, frequency, hematuria and urgency.  Musculoskeletal:  Negative for arthralgias, back pain, joint swelling, myalgias and neck stiffness.  Skin:  Negative for pallor and rash.  Neurological:  Negative for dizziness, speech difficulty, weakness and headaches.  Hematological:  Negative for adenopathy. Does not bruise/bleed easily.  Psychiatric/Behavioral:  Negative for confusion and sleep disturbance. The patient is not nervous/anxious.     PE;    06/09/2024    1:21 PM 05/30/2023   10:38 AM 09/25/2022    2:06 PM  Vitals with BMI  Height 5' 11 5' 11 6' 0  Weight 200 lbs 13 oz 192 lbs 197 lbs  BMI 28.02 26.79 26.71  Systolic 119 127 899  Diastolic 74 85 65  Pulse 76 81 83     Gen: Alert, well appearing.   Patient is oriented to person, place, time, and situation. AFFECT: pleasant, lucid thought and speech. ENT: Ears: EACs clear, normal epithelium.  TMs with good light reflex and landmarks bilaterally.  Eyes: no injection, icteris, swelling, or exudate.  EOMI, PERRLA. Nose: no drainage or turbinate edema/swelling.  No injection or focal lesion.  Mouth: lips without lesion/swelling.  Oral mucosa pink and moist.  Dentition intact and without obvious caries or gingival swelling.  Oropharynx without erythema, exudate, or swelling.  Neck: supple/nontender.  No LAD, mass, or TM.  Carotid pulses 2+ bilaterally, without bruits. CV: RRR, no m/r/g.   LUNGS: CTA bilat, nonlabored resps, good aeration in all lung fields. ABD: soft, NT, ND, BS normal.  No hepatospenomegaly or mass.  No bruits. EXT: no clubbing, cyanosis, or edema.  Musculoskeletal: no joint swelling, erythema, warmth, or tenderness.  ROM of all joints intact. Skin - no sores or suspicious lesions or rashes or color changes  Pertinent labs:  Lab Results  Component Value Date   TSH 1.80 12/20/2020   Lab Results  Component Value Date   WBC 6.5 06/02/2024   HGB 15.6 06/02/2024   HCT 47.1 06/02/2024   MCV 93.4 06/02/2024   PLT 212.0 06/02/2024   Lab Results  Component Value Date   CREATININE 0.89 06/02/2024   BUN 19 06/02/2024   NA 140 06/02/2024   K 5.1 06/02/2024   CL 104 06/02/2024   CO2 30 06/02/2024   Lab Results  Component Value Date   ALT 30 06/02/2024   AST 25 06/02/2024   ALKPHOS 74 06/02/2024   BILITOT 0.9 06/02/2024   Lab Results  Component Value Date   CHOL 131 06/02/2024   Lab Results  Component Value Date   HDL 56.70 06/02/2024   Lab Results  Component Value Date   LDLCALC 58 06/02/2024   Lab Results  Component Value Date   TRIG 80.0 06/02/2024   Lab Results  Component Value Date   CHOLHDL 2 06/02/2024  Lab Results  Component Value Date   PSA 2.24 06/02/2024   PSA 2.60 05/30/2023   PSA 1.79  05/27/2022   Lab Results  Component Value Date   HGBA1C 5.5 06/02/2024   ASSESSMENT AND PLAN:   No problem-specific Assessment & Plan notes found for this encounter.  #1 health maintenance exam: Reviewed age and gender appropriate health maintenance issues (prudent diet, regular exercise, health risks of tobacco and excessive alcohol, use of seatbelts, fire alarms in home, use of sunscreen).  Also reviewed age and gender appropriate health screening as well as vaccine recommendations. Vaccines: Flu->given today.  Prevnar 20-->declined. Labs: Reviewed recent labs on 06/02/2024 in detail--> all normal. Prostate ca screening: PSA normal. Colon ca screening: History of polyps.  07/16/2021 colonoscopy did not show any polyps, though.  #2 hypertension, well-controlled on lisinopril  10 mg a day. Electrolytes and creatinine normal 1 week ago.  #3 hypercholesterolemia, well-controlled on atorvastatin  40 mg a day long-term. LDL was 58 about 1 week ago. Will take statin holiday to see if his intermittent muscle cramps in the legs resolved. If they do not improve then he will restart the statin in about a month. If they do improve he will remain off the medication and come in 3 months for lab appointment to check cholesterol.  #4 muscle cramps. Plan is statin holiday--> see #3 above.  #5 inflammatory arthritis, rheumatoid factor negative. Followed by rheumatology.  On Enbrel and meloxicam.  An After Visit Summary was printed and given to the patient.  FOLLOW UP:  Return in about 1 year (around 06/09/2025) for annual CPE (fasting).  Signed:  Gerlene Hockey, MD           06/09/2024

## 2024-08-19 ENCOUNTER — Other Ambulatory Visit: Payer: Self-pay | Admitting: Family Medicine
